# Patient Record
Sex: Male | Born: 1955 | Race: Black or African American | Hispanic: No | Smoking: Current every day smoker
Health system: Southern US, Community
[De-identification: ages and names within clinical notes are randomized; demographics above are authoritative.]

## PROBLEM LIST (undated history)

## (undated) DIAGNOSIS — R0600 Dyspnea, unspecified: Secondary | ICD-10-CM

## (undated) DIAGNOSIS — S72009A Fracture of unspecified part of neck of unspecified femur, initial encounter for closed fracture: Secondary | ICD-10-CM

## (undated) DIAGNOSIS — I639 Cerebral infarction, unspecified: Secondary | ICD-10-CM

## (undated) DIAGNOSIS — I10 Essential (primary) hypertension: Secondary | ICD-10-CM

---

## 1998-03-28 ENCOUNTER — Emergency Department (HOSPITAL_COMMUNITY): Admission: EM | Admit: 1998-03-28 | Discharge: 1998-03-28 | Payer: Self-pay | Admitting: Emergency Medicine

## 1998-08-22 ENCOUNTER — Emergency Department (HOSPITAL_COMMUNITY): Admission: EM | Admit: 1998-08-22 | Discharge: 1998-08-22 | Payer: Self-pay | Admitting: Emergency Medicine

## 1999-06-28 ENCOUNTER — Encounter: Payer: Self-pay | Admitting: Emergency Medicine

## 1999-06-28 ENCOUNTER — Emergency Department (HOSPITAL_COMMUNITY): Admission: EM | Admit: 1999-06-28 | Discharge: 1999-06-28 | Payer: Self-pay | Admitting: Emergency Medicine

## 1999-07-06 ENCOUNTER — Encounter: Payer: Self-pay | Admitting: Emergency Medicine

## 1999-07-06 ENCOUNTER — Emergency Department (HOSPITAL_COMMUNITY): Admission: EM | Admit: 1999-07-06 | Discharge: 1999-07-06 | Payer: Self-pay | Admitting: Emergency Medicine

## 2010-06-24 ENCOUNTER — Inpatient Hospital Stay (HOSPITAL_COMMUNITY)
Admission: EM | Admit: 2010-06-24 | Discharge: 2010-06-27 | DRG: 183 | Disposition: A | Discharge status: Home / Self Care | Payer: Medicaid Other | Source: Other Acute Inpatient Hospital | Attending: General Surgery | Admitting: General Surgery

## 2010-06-24 ENCOUNTER — Emergency Department (HOSPITAL_COMMUNITY): Payer: Medicaid Other

## 2010-06-24 ENCOUNTER — Emergency Department (HOSPITAL_COMMUNITY)
Admission: EM | Admit: 2010-06-24 | Discharge: 2010-06-24 | Disposition: A | Discharge status: Other Acute Inpatient Hospital | Payer: Medicaid Other | Attending: Emergency Medicine | Admitting: Emergency Medicine

## 2010-06-24 DIAGNOSIS — K7030 Alcoholic cirrhosis of liver without ascites: Secondary | ICD-10-CM | POA: Diagnosis present

## 2010-06-24 DIAGNOSIS — S2249XA Multiple fractures of ribs, unspecified side, initial encounter for closed fracture: Principal | ICD-10-CM | POA: Diagnosis present

## 2010-06-24 DIAGNOSIS — Y929 Unspecified place or not applicable: Secondary | ICD-10-CM

## 2010-06-24 DIAGNOSIS — IMO0002 Reserved for concepts with insufficient information to code with codable children: Secondary | ICD-10-CM | POA: Insufficient documentation

## 2010-06-24 DIAGNOSIS — F17200 Nicotine dependence, unspecified, uncomplicated: Secondary | ICD-10-CM | POA: Diagnosis present

## 2010-06-24 DIAGNOSIS — D62 Acute posthemorrhagic anemia: Secondary | ICD-10-CM | POA: Diagnosis present

## 2010-06-24 DIAGNOSIS — S36039A Unspecified laceration of spleen, initial encounter: Secondary | ICD-10-CM | POA: Diagnosis present

## 2010-06-24 DIAGNOSIS — I959 Hypotension, unspecified: Secondary | ICD-10-CM | POA: Insufficient documentation

## 2010-06-24 DIAGNOSIS — S3600XA Unspecified injury of spleen, initial encounter: Secondary | ICD-10-CM | POA: Insufficient documentation

## 2010-06-24 DIAGNOSIS — R4182 Altered mental status, unspecified: Secondary | ICD-10-CM | POA: Insufficient documentation

## 2010-06-24 DIAGNOSIS — F1020 Alcohol dependence, uncomplicated: Secondary | ICD-10-CM | POA: Diagnosis present

## 2010-06-24 DIAGNOSIS — Y998 Other external cause status: Secondary | ICD-10-CM | POA: Insufficient documentation

## 2010-06-24 DIAGNOSIS — R188 Other ascites: Secondary | ICD-10-CM | POA: Insufficient documentation

## 2010-06-24 DIAGNOSIS — Y92009 Unspecified place in unspecified non-institutional (private) residence as the place of occurrence of the external cause: Secondary | ICD-10-CM | POA: Insufficient documentation

## 2010-06-24 DIAGNOSIS — J9819 Other pulmonary collapse: Secondary | ICD-10-CM | POA: Diagnosis present

## 2010-06-24 LAB — URINALYSIS, ROUTINE W REFLEX MICROSCOPIC
Glucose, UA: NEGATIVE mg/dL
Leukocytes, UA: NEGATIVE
Specific Gravity, Urine: 1.025 (ref 1.005–1.030)
pH: 5.5 (ref 5.0–8.0)

## 2010-06-24 LAB — CBC
HCT: 34.6 % — ABNORMAL LOW (ref 39.0–52.0)
HCT: 28.5 % — ABNORMAL LOW (ref 39.0–52.0)
Hemoglobin: 12.3 g/dL — ABNORMAL LOW (ref 13.0–17.0)
Hemoglobin: 10.1 g/dL — ABNORMAL LOW (ref 13.0–17.0)
MCHC: 35 g/dL (ref 30.0–36.0)
MCHC: 35.4 g/dL (ref 30.0–36.0)
Platelets: 203 10*3/uL (ref 150–400)
Platelets: 214 10*3/uL (ref 150–400)
RBC: 3.61 MIL/uL — ABNORMAL LOW (ref 4.22–5.81)
RDW: 13.5 % (ref 11.5–15.5)
RDW: 13.7 % (ref 11.5–15.5)
RDW: 13.7 % (ref 11.5–15.5)
WBC: 14.2 10*3/uL — ABNORMAL HIGH (ref 4.0–10.5)
WBC: 12.6 10*3/uL — ABNORMAL HIGH (ref 4.0–10.5)
WBC: 11.2 10*3/uL — ABNORMAL HIGH (ref 4.0–10.5)
WBC: 9.8 10*3/uL (ref 4.0–10.5)

## 2010-06-24 LAB — URINE MICROSCOPIC-ADD ON

## 2010-06-24 LAB — DIFFERENTIAL
Basophils Absolute: 0 10*3/uL (ref 0.0–0.1)
Basophils Absolute: 0 10*3/uL (ref 0.0–0.1)
Basophils Relative: 0 % (ref 0–1)
Eosinophils Absolute: 0 10*3/uL (ref 0.0–0.7)
Eosinophils Relative: 0 % (ref 0–5)
Lymphocytes Relative: 9 % — ABNORMAL LOW (ref 12–46)
Lymphocytes Relative: 9 % — ABNORMAL LOW (ref 12–46)
Monocytes Absolute: 0.8 10*3/uL (ref 0.1–1.0)
Neutro Abs: 12 10*3/uL — ABNORMAL HIGH (ref 1.7–7.7)
Neutrophils Relative %: 85 % — ABNORMAL HIGH (ref 43–77)

## 2010-06-24 LAB — PROTIME-INR: INR: 1.19 (ref 0.00–1.49)

## 2010-06-24 LAB — COMPREHENSIVE METABOLIC PANEL
AST: 42 U/L — ABNORMAL HIGH (ref 0–37)
Albumin: 3.9 g/dL (ref 3.5–5.2)
Chloride: 100 mEq/L (ref 96–112)
Creatinine, Ser: 1.51 mg/dL — ABNORMAL HIGH (ref 0.50–1.35)
Potassium: 4.6 mEq/L (ref 3.5–5.1)
Total Bilirubin: 0.3 mg/dL (ref 0.3–1.2)
Total Protein: 7.3 g/dL (ref 6.0–8.3)

## 2010-06-24 LAB — MRSA PCR SCREENING: MRSA by PCR: NEGATIVE

## 2010-06-24 LAB — APTT: aPTT: 28 seconds (ref 24–37)

## 2010-06-24 MED ORDER — IOHEXOL 300 MG/ML  SOLN
100.0000 mL | Freq: Once | INTRAMUSCULAR | Status: AC | PRN
  Administered 2010-06-24: 100 mL via INTRAVENOUS

## 2010-06-25 ENCOUNTER — Inpatient Hospital Stay (HOSPITAL_COMMUNITY): Payer: Medicaid Other

## 2010-06-25 LAB — URINE CULTURE
Colony Count: NO GROWTH
Culture  Setup Time: 201206192008
Culture: NO GROWTH

## 2010-06-25 LAB — CBC
Hemoglobin: 9.3 g/dL — ABNORMAL LOW (ref 13.0–17.0)
Hemoglobin: 9.7 g/dL — ABNORMAL LOW (ref 13.0–17.0)
MCH: 31.8 pg (ref 26.0–34.0)
MCV: 87.7 fL (ref 78.0–100.0)
Platelets: 164 10*3/uL (ref 150–400)
Platelets: 182 10*3/uL (ref 150–400)
RBC: 2.92 MIL/uL — ABNORMAL LOW (ref 4.22–5.81)
RBC: 3.14 MIL/uL — ABNORMAL LOW (ref 4.22–5.81)
WBC: 8.9 10*3/uL (ref 4.0–10.5)
WBC: 8.6 10*3/uL (ref 4.0–10.5)

## 2010-06-25 LAB — BASIC METABOLIC PANEL
Calcium: 8.1 mg/dL — ABNORMAL LOW (ref 8.4–10.5)
GFR calc non Af Amer: >60 mL/min (ref >60)
Glucose, Bld: 91 mg/dL (ref 70–99)
Potassium: 3.9 mEq/L (ref 3.5–5.1)
Sodium: 134 mEq/L — ABNORMAL LOW (ref 135–145)

## 2010-06-25 LAB — APTT: aPTT: 34 seconds (ref 24–37)

## 2010-06-25 LAB — PROTIME-INR: Prothrombin Time: 14.9 seconds (ref 11.6–15.2)

## 2010-06-26 LAB — COMPREHENSIVE METABOLIC PANEL
ALT: 20 U/L (ref 0–53)
AST: 20 U/L (ref 0–37)
Albumin: 3.1 g/dL — ABNORMAL LOW (ref 3.5–5.2)
CO2: 27 mEq/L (ref 19–32)
Calcium: 8.6 mg/dL (ref 8.4–10.5)
Creatinine, Ser: 0.81 mg/dL (ref 0.50–1.35)
GFR calc non Af Amer: >60 mL/min (ref >60)
Sodium: 137 mEq/L (ref 135–145)
Total Protein: 6 g/dL (ref 6.0–8.3)

## 2010-06-26 LAB — CBC
MCH: 31.6 pg (ref 26.0–34.0)
MCHC: 36.4 g/dL — ABNORMAL HIGH (ref 30.0–36.0)
MCV: 86.8 fL (ref 78.0–100.0)
Platelets: 179 10*3/uL (ref 150–400)
RDW: 13.1 % (ref 11.5–15.5)

## 2010-06-27 LAB — CBC
HCT: 27.1 % — ABNORMAL LOW (ref 39.0–52.0)
Hemoglobin: 9.6 g/dL — ABNORMAL LOW (ref 13.0–17.0)
MCH: 30.8 pg (ref 26.0–34.0)
RBC: 3.12 MIL/uL — ABNORMAL LOW (ref 4.22–5.81)
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
## 2010-07-10 ENCOUNTER — Ambulatory Visit (INDEPENDENT_AMBULATORY_CARE_PROVIDER_SITE_OTHER): Payer: Self-pay | Admitting: Physician Assistant

## 2010-07-10 DIAGNOSIS — D62 Acute posthemorrhagic anemia: Secondary | ICD-10-CM | POA: Insufficient documentation

## 2010-07-10 DIAGNOSIS — S2249XA Multiple fractures of ribs, unspecified side, initial encounter for closed fracture: Secondary | ICD-10-CM | POA: Insufficient documentation

## 2010-07-10 DIAGNOSIS — S36039A Unspecified laceration of spleen, initial encounter: Secondary | ICD-10-CM | POA: Insufficient documentation
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
## 2010-07-10 NOTE — Patient Instructions (Signed)
Gradually increase activities as tolerated.  Follow-up as needed.

## 2010-08-03 ENCOUNTER — Other Ambulatory Visit: Payer: Self-pay

## 2010-08-03 ENCOUNTER — Inpatient Hospital Stay (HOSPITAL_COMMUNITY)
Admission: EM | Admit: 2010-08-03 | Discharge: 2010-08-06 | DRG: 815 | Disposition: A | Discharge status: Home / Self Care | Payer: Medicaid Other | Attending: General Surgery | Admitting: General Surgery

## 2010-08-03 ENCOUNTER — Emergency Department (HOSPITAL_COMMUNITY): Payer: Medicaid Other

## 2010-08-03 ENCOUNTER — Encounter: Payer: Self-pay | Admitting: Emergency Medicine

## 2010-08-03 ENCOUNTER — Emergency Department (HOSPITAL_COMMUNITY)
Admission: EM | Admit: 2010-08-03 | Discharge: 2010-08-03 | Disposition: A | Discharge status: Other Acute Inpatient Hospital | Payer: Medicaid Other | Source: Home / Self Care | Attending: Emergency Medicine | Admitting: Emergency Medicine

## 2010-08-03 DIAGNOSIS — D62 Acute posthemorrhagic anemia: Secondary | ICD-10-CM | POA: Diagnosis present

## 2010-08-03 DIAGNOSIS — Z87891 Personal history of nicotine dependence: Secondary | ICD-10-CM | POA: Insufficient documentation

## 2010-08-03 DIAGNOSIS — S36039A Unspecified laceration of spleen, initial encounter: Secondary | ICD-10-CM

## 2010-08-03 DIAGNOSIS — R079 Chest pain, unspecified: Secondary | ICD-10-CM

## 2010-08-03 DIAGNOSIS — S2249XA Multiple fractures of ribs, unspecified side, initial encounter for closed fracture: Secondary | ICD-10-CM | POA: Insufficient documentation

## 2010-08-03 DIAGNOSIS — K5900 Constipation, unspecified: Secondary | ICD-10-CM | POA: Diagnosis present

## 2010-08-03 DIAGNOSIS — S3600XA Unspecified injury of spleen, initial encounter: Secondary | ICD-10-CM

## 2010-08-03 DIAGNOSIS — IMO0001 Reserved for inherently not codable concepts without codable children: Secondary | ICD-10-CM

## 2010-08-03 DIAGNOSIS — S3609XA Other injury of spleen, initial encounter: Secondary | ICD-10-CM | POA: Insufficient documentation

## 2010-08-03 DIAGNOSIS — F17200 Nicotine dependence, unspecified, uncomplicated: Secondary | ICD-10-CM | POA: Diagnosis present

## 2010-08-03 LAB — TYPE AND SCREEN
ABO/RH(D): O POS
Antibody Screen: NEGATIVE

## 2010-08-03 LAB — CARDIAC PANEL(CRET KIN+CKTOT+MB+TROPI)
CK, MB: 2.3 ng/mL (ref 0.3–4.0)
Relative Index: 1.3 (ref 0.0–2.5)
Troponin I: <0.3 ng/mL (ref <0.30)

## 2010-08-03 LAB — GLUCOSE, CAPILLARY

## 2010-08-03 LAB — COMPREHENSIVE METABOLIC PANEL
ALT: 8 U/L (ref 0–53)
AST: 17 U/L (ref 0–37)
Albumin: 3.8 g/dL (ref 3.5–5.2)
Alkaline Phosphatase: 63 U/L (ref 39–117)
BUN: 21 mg/dL (ref 6–23)
Chloride: 103 mEq/L (ref 96–112)
Potassium: 3.5 mEq/L (ref 3.5–5.1)
Sodium: 139 mEq/L (ref 135–145)
Total Bilirubin: 0.7 mg/dL (ref 0.3–1.2)
Total Protein: 7.9 g/dL (ref 6.0–8.3)

## 2010-08-03 LAB — CBC
HCT: 26 % — ABNORMAL LOW (ref 39.0–52.0)
Hemoglobin: 9.2 g/dL — ABNORMAL LOW (ref 13.0–17.0)
Hemoglobin: 8 g/dL — ABNORMAL LOW (ref 13.0–17.0)
MCV: 87.2 fL (ref 78.0–100.0)
RBC: 2.67 MIL/uL — ABNORMAL LOW (ref 4.22–5.81)
RDW: 13.1 % (ref 11.5–15.5)
WBC: 15.7 10*3/uL — ABNORMAL HIGH (ref 4.0–10.5)
WBC: 12.5 10*3/uL — ABNORMAL HIGH (ref 4.0–10.5)

## 2010-08-03 LAB — APTT: aPTT: 35 seconds (ref 24–37)

## 2010-08-03 LAB — ABO/RH: ABO/RH(D): O POS

## 2010-08-03 LAB — D-DIMER, QUANTITATIVE: D-Dimer, Quant: 6.7 ug/mL-FEU — ABNORMAL HIGH (ref 0.00–0.48)

## 2010-08-03 MED ORDER — ALBUTEROL SULFATE (5 MG/ML) 0.5% IN NEBU
INHALATION_SOLUTION | RESPIRATORY_TRACT | Status: AC
  Filled 2010-08-03: qty 1

## 2010-08-03 MED ORDER — SODIUM CHLORIDE 0.9 % IV SOLN
Freq: Once | INTRAVENOUS | Status: AC
  Administered 2010-08-03: 14:00:00 via INTRAVENOUS

## 2010-08-03 MED ORDER — ALBUTEROL SULFATE (5 MG/ML) 0.5% IN NEBU: 2.5000 mg | INHALATION_SOLUTION | RESPIRATORY_TRACT | Status: DC

## 2010-08-03 MED ORDER — SODIUM CHLORIDE 0.9 % IJ SOLN: 3.0000 mL | INTRAMUSCULAR | Status: DC | PRN

## 2010-08-03 MED ORDER — MORPHINE SULFATE 4 MG/ML IJ SOLN
4.0000 mg | Freq: Once | INTRAMUSCULAR | Status: AC
  Administered 2010-08-03: 4 mg via INTRAVENOUS
  Filled 2010-08-03: qty 1

## 2010-08-03 MED ORDER — NITROGLYCERIN 0.4 MG/SPRAY TL SOLN
1.0000 | Freq: Once | Status: AC
  Administered 2010-08-03: 1 via SUBLINGUAL
  Filled 2010-08-03: qty 4.9

## 2010-08-03 MED ORDER — ALBUTEROL SULFATE (5 MG/ML) 0.5% IN NEBU: 5.0000 mg | INHALATION_SOLUTION | RESPIRATORY_TRACT | Status: DC

## 2010-08-03 MED ORDER — IOHEXOL 300 MG/ML  SOLN
50.0000 mL | Freq: Once | INTRAMUSCULAR | Status: AC | PRN
  Administered 2010-08-03: 50 mL via INTRAVENOUS

## 2010-08-03 MED ORDER — IOHEXOL 350 MG/ML SOLN
100.0000 mL | Freq: Once | INTRAVENOUS | Status: AC | PRN
  Administered 2010-08-03: 100 mL via INTRAVENOUS

## 2010-08-03 NOTE — ED Notes (Signed)
Pt dosing on and off, family at bedside. Rates pain 5/10. Vitals as documented. No acute distress noted. Airway patent.
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
## 2010-08-04 LAB — CBC
HCT: 20.6 % — ABNORMAL LOW (ref 39.0–52.0)
HCT: 22.6 % — ABNORMAL LOW (ref 39.0–52.0)
HCT: 23.6 % — ABNORMAL LOW (ref 39.0–52.0)
Hemoglobin: 7.6 g/dL — ABNORMAL LOW (ref 13.0–17.0)
MCH: 30.2 pg (ref 26.0–34.0)
MCH: 29.9 pg (ref 26.0–34.0)
MCH: 30 pg (ref 26.0–34.0)
MCHC: 34.5 g/dL (ref 30.0–36.0)
MCV: 85.8 fL (ref 78.0–100.0)
MCV: 86.1 fL (ref 78.0–100.0)
MCV: 86.9 fL (ref 78.0–100.0)
MCV: 87.1 fL (ref 78.0–100.0)
Platelets: 241 10*3/uL (ref 150–400)
Platelets: 300 10*3/uL (ref 150–400)
RBC: 2.4 MIL/uL — ABNORMAL LOW (ref 4.22–5.81)
RBC: 2.52 MIL/uL — ABNORMAL LOW (ref 4.22–5.81)
RBC: 2.71 MIL/uL — ABNORMAL LOW (ref 4.22–5.81)
RDW: 13.4 % (ref 11.5–15.5)
WBC: 9.3 10*3/uL (ref 4.0–10.5)

## 2010-08-04 LAB — BASIC METABOLIC PANEL
CO2: 26 mEq/L (ref 19–32)
Calcium: 8.6 mg/dL (ref 8.4–10.5)
Glucose, Bld: 116 mg/dL — ABNORMAL HIGH (ref 70–99)
Sodium: 139 mEq/L (ref 135–145)

## 2010-08-05 LAB — CBC
MCH: 30.3 pg (ref 26.0–34.0)
MCHC: 34.7 g/dL (ref 30.0–36.0)
MCHC: 34.7 g/dL (ref 30.0–36.0)
Platelets: 279 10*3/uL (ref 150–400)
Platelets: 317 10*3/uL (ref 150–400)
RDW: 13.3 % (ref 11.5–15.5)
RDW: 13.4 % (ref 11.5–15.5)
WBC: 11 10*3/uL — ABNORMAL HIGH (ref 4.0–10.5)

## 2010-08-06 LAB — CBC
Hemoglobin: 7.8 g/dL — ABNORMAL LOW (ref 13.0–17.0)
Platelets: 356 10*3/uL (ref 150–400)
RBC: 2.6 MIL/uL — ABNORMAL LOW (ref 4.22–5.81)
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
## 2010-08-14 ENCOUNTER — Encounter (INDEPENDENT_AMBULATORY_CARE_PROVIDER_SITE_OTHER): Payer: Self-pay
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
## 2017-04-20 ENCOUNTER — Other Ambulatory Visit (HOSPITAL_COMMUNITY): Payer: Self-pay | Admitting: Internal Medicine

## 2017-04-20 DIAGNOSIS — R109 Unspecified abdominal pain: Secondary | ICD-10-CM

## 2017-04-29 ENCOUNTER — Other Ambulatory Visit (HOSPITAL_COMMUNITY): Payer: Self-pay | Admitting: Internal Medicine

## 2017-04-29 ENCOUNTER — Ambulatory Visit (HOSPITAL_COMMUNITY): Payer: Medicaid Other

## 2017-04-29 DIAGNOSIS — R109 Unspecified abdominal pain: Secondary | ICD-10-CM

## 2017-05-03 ENCOUNTER — Ambulatory Visit (HOSPITAL_COMMUNITY): Admission: RE | Admit: 2017-05-03 | Payer: Medicaid Other | Source: Ambulatory Visit

## 2017-05-03 ENCOUNTER — Ambulatory Visit (HOSPITAL_COMMUNITY)
Admission: RE | Admit: 2017-05-03 | Discharge: 2017-05-03 | Disposition: A | Discharge status: Home / Self Care | Payer: Medicaid Other | Source: Ambulatory Visit | Attending: Internal Medicine | Admitting: Internal Medicine

## 2017-05-03 ENCOUNTER — Other Ambulatory Visit (HOSPITAL_COMMUNITY): Payer: Medicaid Other

## 2017-05-03 DIAGNOSIS — K824 Cholesterolosis of gallbladder: Secondary | ICD-10-CM | POA: Diagnosis not present

## 2017-05-03 DIAGNOSIS — R109 Unspecified abdominal pain: Secondary | ICD-10-CM | POA: Diagnosis present

## 2017-05-14 ENCOUNTER — Ambulatory Visit (HOSPITAL_COMMUNITY): Payer: Medicaid Other

## 2017-09-23 ENCOUNTER — Ambulatory Visit (INDEPENDENT_AMBULATORY_CARE_PROVIDER_SITE_OTHER): Payer: Self-pay

## 2017-09-23 DIAGNOSIS — Z1211 Encounter for screening for malignant neoplasm of colon: Secondary | ICD-10-CM

## 2017-09-23 MED ORDER — NA SULFATE-K SULFATE-MG SULF 17.5-3.13-1.6 GM/177ML PO SOLN
1.0000 | ORAL | 0 refills | Status: DC
Start: 2017-09-23 — End: 2022-06-10

## 2017-09-23 NOTE — Progress Notes (Signed)
Gastroenterology Pre-Procedure Review  Request Date:09/23/17 Requesting Physician: Dr.Fanta, no previous tcs  PATIENT REVIEW QUESTIONS: The patient responded to the following health history questions as indicated:    1. Diabetes Melitis: no 2. Joint replacements in the past 12 months: no 3. Major health problems in the past 3 months: no 4. Has an artificial valve or MVP: no 5. Has a defibrillator: no 6. Has been advised in past to take antibiotics in advance of a procedure like teeth cleaning: no 7. Family history of colon cancer: no  8. Alcohol Use: yes (occasionally) 9. History of sleep apnea: no  10. History of coronary artery or other vascular stents placed within the last 12 months: no 11. History of any prior anesthesia complications: no    MEDICATIONS & ALLERGIES:    Patient reports the following regarding taking any blood thinners:   Plavix? no Aspirin? no Coumadin? no Brilinta? no Xarelto? no Eliquis? no Pradaxa? no Savaysa? no Effient? no  Patient confirms/reports the following medications:  Current Outpatient Medications  Medication Sig Dispense Refill  . amLODipine (NORVASC) 5 MG tablet Take 5 mg by mouth daily.     No current facility-administered medications for this visit.     Patient confirms/reports the following allergies:  Allergies  Allergen Reactions  . Fish Allergy Anaphylaxis    No orders of the defined types were placed in this encounter.   AUTHORIZATION INFORMATION Primary Insurance: [place],  [place] #: [id number] p Pre-Cert / Josem Kaufmann required: no   SCHEDULE INFORMATION: Procedure has been scheduled as follows:  Date: 11/05/17, Time: 12:00  Location: [facility] Dr.Rourk  This Gastroenterology Pre-Precedure Review Form is being routed to the following provider(s): Roseanne Kaufman NP

## 2017-09-23 NOTE — Patient Instructions (Signed)
Austin [name]  [birth year] MRN: [id number]     Procedure Date: 11/05/17 Time to register: 11:00am Place to register: [facility] Procedure Time: 12:00pm Scheduled provider: R. Garfield Cornea, MD    PREPARATION FOR COLONOSCOPY WITH SUPREP BOWEL PREP KIT  Note: Suprep Bowel Prep Kit is a split-dose (2day) regimen. Consumption of BOTH 6-ounce bottles is required for a complete prep.  Please notify us immediately if you are diabetic, take iron supplements, or if you are on Coumadin or any other blood thinners.                                                                                                                                                 2 DAYS BEFORE PROCEDURE:  DATE: 11/03/17  DAY: Wednesday Begin clear liquid diet AFTER your lunch meal. NO SOLID FOODS after this point.  1 DAY BEFORE PROCEDURE:  DATE:11/04/17    DAY: Thursday Continue clear liquids the entire day - NO SOLID FOOD.    At 6:00pm: Complete steps 1 through 4 below, using ONE (1) 6-ounce bottle, before going to bed. Step 1:  Pour ONE (1) 6-ounce bottle of SUPREP liquid into the mixing container.  Step 2:  Add cool drinking water to the 16 ounce line on the container and mix.  Note: Dilute the solution concentrate as directed prior to use. Step 3:  DRINK ALL the liquid in the container. Step 4:  You MUST drink an additional two (2) or more 16 ounce containers of water over the next one (1) hour.   Continue clear liquids.  DAY OF PROCEDURE:   DATE:11/05/17    DAY: Friday If you take medications for your heart, blood pressure, or breathing, you may take these medications.   5 hours before your procedure at :7:00am Step 1:  Pour ONE (1) 6-ounce bottle of SUPREP liquid into the mixing container.  Step 2:  Add cool drinking water to the 16 ounce line on the container and mix.  Note: Dilute the solution concentrate as directed prior to use. Step 3:  DRINK ALL the liquid in the container. Step 4:   You MUST drink an additional two (2) or more 16 ounce containers of water over the next one (1) hour. You MUST complete the final glass of water at least 3 hours before your colonoscopy.   Nothing by mouth past:9:00am  You may take your morning medications with sip of water unless we have instructed otherwise.    Please see below for Dietary Information.  CLEAR LIQUIDS INCLUDE:  Water Jello (NOT red in color)   Ice Popsicles (NOT red in color)   Tea (sugar ok, no milk/cream) Powdered fruit flavored drinks  Coffee (sugar ok, no milk/cream) Gatorade/ Lemonade/ Kool-Aid  (NOT red in color)   Juice: apple, white grape, white cranberry Soft drinks  Clear bullion,  consomme, broth (fat free beef/chicken/vegetable)  Carbonated beverages (any kind)  Strained chicken noodle soup Hard Candy   Remember: Clear liquids are liquids that will allow you to see your fingers on the other side of a clear glass. Be sure liquids are NOT red in color, and not cloudy, but CLEAR.  DO NOT EAT OR DRINK ANY OF THE FOLLOWING:  Dairy products of any kind   Cranberry juice Tomato juice / V8 juice   Grapefruit juice Orange juice     Red grape juice  Do not eat any solid foods, including such foods as: cereal, oatmeal, yogurt, fruits, vegetables, creamed soups, eggs, bread, crackers, pureed foods in a blender, etc.   HELPFUL HINTS FOR DRINKING PREP SOLUTION:   Make sure prep is extremely cold. Mix and refrigerate the the morning of the prep. You may also put in the freezer.   You may try mixing some Crystal Light or Country Time Lemonade if you prefer. Mix in small amounts; add more if necessary.  Try drinking through a straw  Rinse mouth with water or a mouthwash between glasses, to remove after-taste.  Try sipping on a cold beverage /ice/ popsicles between glasses of prep.  Place a piece of sugar-free hard candy in mouth between glasses.  If you become nauseated, try consuming smaller amounts, or stretch  out the time between glasses. Stop for 30-60 minutes, then slowly start back drinking.     OTHER INSTRUCTIONS  You will need a responsible adult at least 64 years of age to accompany you and drive you home. This person must remain in the waiting room during your procedure. The hospital will cancel your procedure if you do not have a responsible adult with you.   1. Wear loose fitting clothing that is easily removed. 2. Leave jewelry and other valuables at home.  3. Remove all body piercing jewelry and leave at home. 4. Total time from sign-in until discharge is approximately 2-3 hours. 5. You should go home directly after your procedure and rest. You can resume normal activities the day after your procedure. 6. The day of your procedure you should not:  Drive  Make legal decisions  Operate machinery  Drink alcohol  Return to work   You may call the office (Dept: [phone]) before 5:00pm, or page the doctor on call [phone]) after 5:00pm, for further instructions, if necessary.   Insurance Information YOU WILL NEED TO CHECK WITH YOUR INSURANCE COMPANY FOR THE BENEFITS OF COVERAGE YOU HAVE FOR THIS PROCEDURE.  UNFORTUNATELY, NOT ALL INSURANCE COMPANIES HAVE BENEFITS TO COVER ALL OR PART OF THESE TYPES OF PROCEDURES.  IT IS YOUR RESPONSIBILITY TO CHECK YOUR BENEFITS, HOWEVER, WE WILL BE GLAD TO ASSIST YOU WITH ANY CODES YOUR INSURANCE COMPANY MAY NEED.    PLEASE NOTE THAT MOST INSURANCE COMPANIES WILL NOT COVER A SCREENING COLONOSCOPY FOR PEOPLE UNDER THE AGE OF 50  IF YOU HAVE BCBS INSURANCE, YOU MAY HAVE BENEFITS FOR A SCREENING COLONOSCOPY BUT IF POLYPS ARE FOUND THE DIAGNOSIS WILL CHANGE AND THEN YOU MAY HAVE A DEDUCTIBLE THAT WILL NEED TO BE MET. SO PLEASE MAKE SURE YOU CHECK YOUR BENEFITS FOR A SCREENING COLONOSCOPY AS WELL AS A DIAGNOSTIC COLONOSCOPY.

## 2017-09-27 NOTE — Progress Notes (Signed)
Appropriate.

## 2017-11-05 ENCOUNTER — Encounter (HOSPITAL_COMMUNITY): Payer: Self-pay | Admitting: *Deleted

## 2017-11-05 ENCOUNTER — Encounter (HOSPITAL_COMMUNITY)
Admission: RE | Disposition: A | Discharge status: Home / Self Care | Payer: Self-pay | Source: Ambulatory Visit | Attending: Internal Medicine

## 2017-11-05 ENCOUNTER — Ambulatory Visit (HOSPITAL_COMMUNITY)
Admission: RE | Admit: 2017-11-05 | Discharge: 2017-11-05 | Disposition: A | Discharge status: Home / Self Care | Payer: Medicaid Other | Source: Ambulatory Visit | Attending: Internal Medicine | Admitting: Internal Medicine

## 2017-11-05 ENCOUNTER — Other Ambulatory Visit: Payer: Self-pay

## 2017-11-05 DIAGNOSIS — Z1211 Encounter for screening for malignant neoplasm of colon: Secondary | ICD-10-CM | POA: Insufficient documentation

## 2017-11-05 DIAGNOSIS — Z87891 Personal history of nicotine dependence: Secondary | ICD-10-CM | POA: Diagnosis not present

## 2017-11-05 DIAGNOSIS — D124 Benign neoplasm of descending colon: Secondary | ICD-10-CM | POA: Insufficient documentation

## 2017-11-05 DIAGNOSIS — D123 Benign neoplasm of transverse colon: Secondary | ICD-10-CM | POA: Diagnosis not present

## 2017-11-05 DIAGNOSIS — D125 Benign neoplasm of sigmoid colon: Secondary | ICD-10-CM | POA: Diagnosis not present

## 2017-11-05 DIAGNOSIS — K5730 Diverticulosis of large intestine without perforation or abscess without bleeding: Secondary | ICD-10-CM | POA: Insufficient documentation

## 2017-11-05 HISTORY — PX: POLYPECTOMY: SHX5525

## 2017-11-05 HISTORY — DX: Dyspnea, unspecified: R06.00

## 2017-11-05 HISTORY — PX: COLONOSCOPY: SHX5424

## 2017-11-05 SURGERY — COLONOSCOPY
Anesthesia: Moderate Sedation

## 2017-11-05 MED ORDER — MEPERIDINE HCL 100 MG/ML IJ SOLN
INTRAMUSCULAR | Status: DC | PRN
  Administered 2017-11-05 (×2): 25 mg via INTRAVENOUS

## 2017-11-05 MED ORDER — MIDAZOLAM HCL 5 MG/5ML IJ SOLN
INTRAMUSCULAR | Status: AC
  Filled 2017-11-05: qty 10

## 2017-11-05 MED ORDER — ONDANSETRON HCL 4 MG/2ML IJ SOLN
INTRAMUSCULAR | Status: DC | PRN
  Administered 2017-11-05: 4 mg via INTRAVENOUS

## 2017-11-05 MED ORDER — SODIUM CHLORIDE 0.9 % IV SOLN
INTRAVENOUS | Status: DC
  Administered 2017-11-05: 1000 mL via INTRAVENOUS

## 2017-11-05 MED ORDER — MEPERIDINE HCL 50 MG/ML IJ SOLN
INTRAMUSCULAR | Status: AC
  Filled 2017-11-05: qty 1

## 2017-11-05 MED ORDER — MIDAZOLAM HCL 5 MG/5ML IJ SOLN
INTRAMUSCULAR | Status: DC | PRN
  Administered 2017-11-05 (×2): 1 mg via INTRAVENOUS
  Administered 2017-11-05: 2 mg via INTRAVENOUS
  Administered 2017-11-05: 1 mg via INTRAVENOUS

## 2017-11-05 MED ORDER — ONDANSETRON HCL 4 MG/2ML IJ SOLN
INTRAMUSCULAR | Status: AC
  Filled 2017-11-05: qty 2

## 2017-11-05 MED ORDER — STERILE WATER FOR IRRIGATION IR SOLN
Status: DC | PRN
  Administered 2017-11-05: 13:00:00

## 2017-11-05 NOTE — H&P (Signed)
$'@LOGO'G$ @   Primary Care Physician:  Rosita Fire, MD Primary Gastroenterologist:  Dr. Gala Romney  Pre-Procedure History & Physical: HPI:  Austin [name] is a 62 y.o. male is here for a screening colonoscopy.  No prior colonoscopy.  No GI symptoms.  No family history of colon cancer.  Past Medical History:  Diagnosis Date  . Dyspnea     History reviewed. No pertinent surgical history.  Prior to Admission medications   Medication Sig Start Date End Date Taking? Authorizing Provider  amLODipine (NORVASC) 5 MG tablet Take 5 mg by mouth daily.   Yes [provider]  Na Sulfate-K Sulfate-Mg Sulf (SUPREP BOWEL PREP KIT) 17.5-3.13-1.6 GM/177ML SOLN Take 1 kit by mouth as directed. 09/23/17  Yes Annitta Needs, NP    Allergies as of 09/23/2017 - Review Complete 09/23/2017  Allergen Reaction Noted  . Fish allergy Anaphylaxis 08/03/2010    Family History  Problem Relation Age of Onset  . Hypertension Mother   . Cancer Mother   . Hypertension Father   . Heart failure Other   . Cancer Other     Social History   Socioeconomic History  . Marital status: Legally Separated    Spouse name: Not on file  . Number of children: Not on file  . Years of education: Not on file  . Highest education level: Not on file  Occupational History  . Not on file  Social Needs  . Financial resource strain: Not on file  . Food insecurity:    Worry: Not on file    Inability: Not on file  . Transportation needs:    Medical: Not on file    Non-medical: Not on file  Tobacco Use  . Smoking status: Former Smoker    Packs/day: 0.50    Years: 43.00    Pack years: 21.50  . Smokeless tobacco: Never Used  . Tobacco comment: last used 2 days ago  Substance and Sexual Activity  . Alcohol use: Yes    Comment: beer 2 cans per day  . Drug use: Yes    Types: Marijuana  . Sexual activity: Yes    Birth control/protection: None  Lifestyle  . Physical activity:    Days per week: Not on file   Minutes per session: Not on file  . Stress: Not on file  Relationships  . Social connections:    Talks on phone: Not on file    Gets together: Not on file    Attends religious service: Not on file    Active member of club or organization: Not on file    Attends meetings of clubs or organizations: Not on file    Relationship status: Not on file  . Intimate partner violence:    Fear of current or ex partner: Not on file    Emotionally abused: Not on file    Physically abused: Not on file    Forced sexual activity: Not on file  Other Topics Concern  . Not on file  Social History Narrative  . Not on file    Review of Systems: See HPI, otherwise negative ROS  Physical Exam: BP (!) 145/94   Pulse 99   Temp 98.7 F (37.1 C) (Oral)   Resp 18   Ht 5' 8.5" (1.74 m)   Wt 70.3 kg   SpO2 99%   BMI 23.22 kg/m  General:   Alert,  Well-developed, well-nourished, pleasant and cooperative in NAD te distress. Heart:  Regular rate and rhythm; no  murmurs, clicks, rubs,  or gallops. Abdomen:  Soft, nontender and nondistended. No masses, hepatosplenomegaly or hernias noted. Normal bowel sounds, without guarding, and without rebound.   Impression/Plan: Austin [name] is now here to undergo a screening colonoscopy.  First ever average risk screening examination  Risks, benefits, limitations, imponderables and alternatives regarding colonoscopy have been reviewed with the patient. Questions have been answered. All parties agreeable.     Notice:  This dictation was prepared with Dragon dictation along with smaller phrase technology. Any transcriptional errors that result from this process are unintentional and may not be corrected upon review.

## 2017-11-05 NOTE — Discharge Instructions (Signed)
Diverticulosis Diverticulosis is a condition that develops when small pouches (diverticula) form in the wall of the large intestine (colon). The colon is where water is absorbed and stool is formed. The pouches form when the inside layer of the colon pushes through weak spots in the outer layers of the colon. You may have a few pouches or many of them. What are the causes? The cause of this condition is not known. What increases the risk? The following factors may make you more likely to develop this condition:  Being older than age 27. Your risk for this condition increases with age. Diverticulosis is rare among people younger than age 36. By age 28, many people have it.  Eating a low-fiber diet.  Having frequent constipation.  Being overweight.  Not getting enough exercise.  Smoking.  Taking over-the-counter pain medicines, like aspirin and ibuprofen.  Having a family history of diverticulosis.  What are the signs or symptoms? In most people, there are no symptoms of this condition. If you do have symptoms, they may include:  Bloating.  Cramps in the abdomen.  Constipation or diarrhea.  Pain in the lower left side of the abdomen.  How is this diagnosed? This condition is most often diagnosed during an exam for other colon problems. Because diverticulosis usually has no symptoms, it often cannot be diagnosed independently. This condition may be diagnosed by:  Using a flexible scope to examine the colon (colonoscopy).  Taking an X-ray of the colon after dye has been put into the colon (barium enema).  Doing a CT scan.  How is this treated? You may not need treatment for this condition if you have never developed an infection related to diverticulosis. If you have had an infection before, treatment may include:  Eating a high-fiber diet. This may include eating more fruits, vegetables, and grains.  Taking a fiber supplement.  Taking a live bacteria supplement  (probiotic).  Taking medicine to relax your colon.  Taking antibiotic medicines.  Follow these instructions at home:  Drink 6-8 glasses of water or more each day to prevent constipation.  Try not to strain when you have a bowel movement.  If you have had an infection before: ? Eat more fiber as directed by your health care provider or your diet and nutrition specialist (dietitian). ? Take a fiber supplement or probiotic, if your health care provider approves.  Take over-the-counter and prescription medicines only as told by your health care provider.  If you were prescribed an antibiotic, take it as told by your health care provider. Do not stop taking the antibiotic even if you start to feel better.  Keep all follow-up visits as told by your health care provider. This is important. Contact a health care provider if:  You have pain in your abdomen.  You have bloating.  You have cramps.  You have not had a bowel movement in 3 days. Get help right away if:  Your pain gets worse.  Your bloating becomes very bad.  You have a fever or chills, and your symptoms suddenly get worse.  You vomit.  You have bowel movements that are bloody or black.  You have bleeding from your rectum. Summary  Diverticulosis is a condition that develops when small pouches (diverticula) form in the wall of the large intestine (colon).  You may have a few pouches or many of them.  This condition is most often diagnosed during an exam for other colon problems.  If you have had an  infection related to diverticulosis, treatment may include increasing the fiber in your diet, taking supplements, or taking medicines. This information is not intended to replace advice given to you by your health care provider. Make sure you discuss any questions you have with your health care provider. Document Released: 09/19/2003 Document Revised: 11/11/2015 Document Reviewed: 11/11/2015 Elsevier Interactive  Patient Education  2017 [employer]. Colon Polyps Polyps are tissue growths inside the body. Polyps can grow in many places, including the large intestine (colon). A polyp may be a round bump or a mushroom-shaped growth. You could have one polyp or several. Most colon polyps are noncancerous (benign). However, some colon polyps can become cancerous over time. What are the causes? The exact cause of colon polyps is not known. What increases the risk? This condition is more likely to develop in people who:  Have a family history of colon cancer or colon polyps.  Are older than 53 or older than 45 if they are African American.  Have inflammatory bowel disease, such as ulcerative colitis or Crohn disease.  Are overweight.  Smoke cigarettes.  Do not get enough exercise.  Drink too much alcohol.  Eat a diet that is: ? High in fat and red meat. ? Low in fiber.  Had childhood cancer that was treated with abdominal radiation.  What are the signs or symptoms? Most polyps do not cause symptoms. If you have symptoms, they may include:  Blood coming from your rectum when having a bowel movement.  Blood in your stool.The stool may look dark red or black.  A change in bowel habits, such as constipation or diarrhea.  How is this diagnosed? This condition is diagnosed with a colonoscopy. This is a procedure that uses a lighted, flexible scope to look at the inside of your colon. How is this treated? Treatment for this condition involves removing any polyps that are found. Those polyps will then be tested for cancer. If cancer is found, your health care provider will talk to you about options for colon cancer treatment. Follow these instructions at home: Diet  Eat plenty of fiber, such as fruits, vegetables, and whole grains.  Eat foods that are high in calcium and vitamin D, such as milk, cheese, yogurt, eggs, liver, fish, and broccoli.  Limit foods high in fat, red meats, and  processed meats, such as hot dogs, sausage, bacon, and lunch meats.  Maintain a healthy weight, or lose weight if recommended by your health care provider. General instructions  Do not smoke cigarettes.  Do not drink alcohol excessively.  Keep all follow-up visits as told by your health care provider. This is important. This includes keeping regularly scheduled colonoscopies. Talk to your health care provider about when you need a colonoscopy.  Exercise every day or as told by your health care provider. Contact a health care provider if:  You have new or worsening bleeding during a bowel movement.  You have new or increased blood in your stool.  You have a change in bowel habits.  You unexpectedly lose weight. This information is not intended to replace advice given to you by your health care provider. Make sure you discuss any questions you have with your health care provider. Document Released: 09/18/2003 Document Revised: 05/30/2015 Document Reviewed: 11/12/2014 Elsevier Interactive Patient Education  2018 [employer].  Colonoscopy Discharge Instructions  Read the instructions outlined below and refer to this sheet in the next few weeks. These discharge instructions provide you with general information on caring  for yourself after you leave the hospital. Your doctor may also give you specific instructions. While your treatment has been planned according to the most current medical practices available, unavoidable complications occasionally occur. If you have any problems or questions after discharge, call Dr. Gala Romney at [phone]. ACTIVITY  You may resume your regular activity, but move at a slower pace for the next 24 hours.   Take frequent rest periods for the next 24 hours.   Walking will help get rid of the air and reduce the bloated feeling in your belly (abdomen).   No driving for 24 hours (because of the medicine (anesthesia) used during the test).    Do not sign any  important legal documents or operate any machinery for 24 hours (because of the anesthesia used during the test).  NUTRITION  Drink plenty of fluids.   You may resume your normal diet as instructed by your doctor.   Begin with a light meal and progress to your normal diet. Heavy or fried foods are harder to digest and may make you feel sick to your stomach (nauseated).   Avoid alcoholic beverages for 24 hours or as instructed.  MEDICATIONS  You may resume your normal medications unless your doctor tells you otherwise.  WHAT YOU CAN EXPECT TODAY  Some feelings of bloating in the abdomen.   Passage of more gas than usual.   Spotting of blood in your stool or on the toilet paper.  IF YOU HAD POLYPS REMOVED DURING THE COLONOSCOPY:  No aspirin products for 7 days or as instructed.   No alcohol for 7 days or as instructed.   Eat a soft diet for the next 24 hours.  FINDING OUT THE RESULTS OF YOUR TEST Not all test results are available during your visit. If your test results are not back during the visit, make an appointment with your caregiver to find out the results. Do not assume everything is normal if you have not heard from your caregiver or the medical facility. It is important for you to follow up on all of your test results.  SEEK IMMEDIATE MEDICAL ATTENTION IF:  You have more than a spotting of blood in your stool.   Your belly is swollen (abdominal distention).   You are nauseated or vomiting.   You have a temperature over 101.   You have abdominal pain or discomfort that is severe or gets worse throughout the day.    Colon polyp and diverticulosis information provided  Further recommendations to follow pending review of pathology report

## 2017-11-05 NOTE — Op Note (Signed)
[facility] Patient Name: Austin [name] Procedure Date: 11/05/2017 12:27 PM MRN: [id number] Date of Birth: 10-15-1955 Attending MD: Norvel Richards , MD CSN: [id number] Age: 63 Admit Type: Outpatient Procedure:                Colonoscopy Indications:              Screening for colorectal malignant neoplasm Providers:                Norvel Richards, MD, Otis Peak B. Sharon Seller, RN,                            Randa Spike, Technician Referring MD:              Medicines:                Midazolam 5 mg IV, Meperidine 50 mg IV Complications:            No immediate complications. Estimated Blood Loss:     Estimated blood loss was minimal. Procedure:                Pre-Anesthesia Assessment:                           - Prior to the procedure, a History and Physical                            was performed, and patient medications and                            allergies were reviewed. The patient's tolerance of                            previous anesthesia was also reviewed. The risks                            and benefits of the procedure and the sedation                            options and risks were discussed with the patient.                            All questions were answered, and informed consent                            was obtained. Prior Anticoagulants: The patient has                            taken no previous anticoagulant or antiplatelet                            agents. ASA Grade Assessment: II - A patient with                            mild systemic disease. After reviewing the risks  and benefits, the patient was deemed in                            satisfactory condition to undergo the procedure.                           After obtaining informed consent, the colonoscope                            was passed under direct vision. Throughout the                            procedure, the patient's blood pressure, pulse, and                             oxygen saturations were monitored continuously. The                            CF-HQ190L ([id number]) scope was introduced through                            the anus and advanced to the the cecum, identified                            by appendiceal orifice and ileocecal valve. The                            colonoscopy was performed without difficulty. The                            patient tolerated the procedure well. The quality                            of the bowel preparation was adequate. The                            ileocecal valve, appendiceal orifice, and rectum                            were photographed. The entire colon was well                            visualized. Scope In: 12:44:00 PM Scope Out: 12:57:41 PM Scope Withdrawal Time: 0 hours 9 minutes 48 seconds  Total Procedure Duration: 0 hours 13 minutes 41 seconds  Findings:      The perianal and digital rectal examinations were normal.      Scattered medium-mouthed diverticula were found in the sigmoid colon and       descending colon.      A 5 mm polyp was found in the hepatic flexure. The polyp was sessile.       The polyp was removed with a cold snare. Resection and retrieval were       complete. Estimated blood loss was minimal.      The exam was otherwise without abnormality on direct  and retroflexion       views. Impression:               - Diverticulosis in the sigmoid colon and in the                            descending colon.                           - One 5 mm polyp at the hepatic flexure, removed                            with a cold snare. Resected and retrieved.                           - The examination was otherwise normal on direct                            and retroflexion views. Moderate Sedation:      Moderate (conscious) sedation was administered by the endoscopy nurse       and supervised by the endoscopist. The following parameters were       monitored:  oxygen saturation, heart rate, blood pressure, respiratory       rate, EKG, adequacy of pulmonary ventilation, and response to care.       Total physician intraservice time was 19 minutes. Recommendation:           - Patient has a contact number available for                            emergencies. The signs and symptoms of potential                            delayed complications were discussed with the                            patient. Return to normal activities tomorrow.                            Written discharge instructions were provided to the                            patient.                           - Resume previous diet.                           - Continue present medications.                           - Repeat colonoscopy date to be determined after                            pending pathology results are reviewed for  surveillance based on pathology results.                           - Return to GI office (date not yet determined). Procedure Code(s):        --- Professional ---                           [phone], Colonoscopy, flexible; with removal of                            tumor(s), polyp(s), or other lesion(s) by snare                            technique                           G0500, Moderate sedation services provided by the                            same physician or other qualified health care                            professional performing a gastrointestinal                            endoscopic service that sedation supports,                            requiring the presence of an independent trained                            observer to assist in the monitoring of the                            patient's level of consciousness and physiological                            status; initial 15 minutes of intra-service time;                            patient age 30 years or older (additional time may                             be reported with [phone], as appropriate) Diagnosis Code(s):        --- Professional ---                           Z12.11, Encounter for screening for malignant                            neoplasm of colon                           D12.3, Benign neoplasm of transverse colon (hepatic  flexure or splenic flexure)                           K57.30, Diverticulosis of large intestine without                            perforation or abscess without bleeding CPT copyright 2018 American Medical Association. All rights reserved. The codes documented in this report are preliminary and upon coder review may  be revised to meet current compliance requirements. Cristopher Estimable. Karlina Suares, MD Norvel Richards, MD 11/05/2017 1:04:11 PM This report has been signed electronically. Number of Addenda: 0

## 2017-11-10 ENCOUNTER — Encounter (HOSPITAL_COMMUNITY): Payer: Self-pay | Admitting: Internal Medicine

## 2017-11-12 ENCOUNTER — Encounter: Payer: Self-pay | Admitting: Internal Medicine

## 2019-01-14 IMAGING — US US ABDOMEN COMPLETE
1 series · 14 of 25 positions shown · non-contrast
Comparison: None.

CLINICAL DATA: Abdominal pain.  Left flank pain.

EXAM:
ABDOMEN ULTRASOUND COMPLETE

[Series 1: us abdomen complete · 0.16mm/px · 14 of 131 slices shown]
[im 1/131]
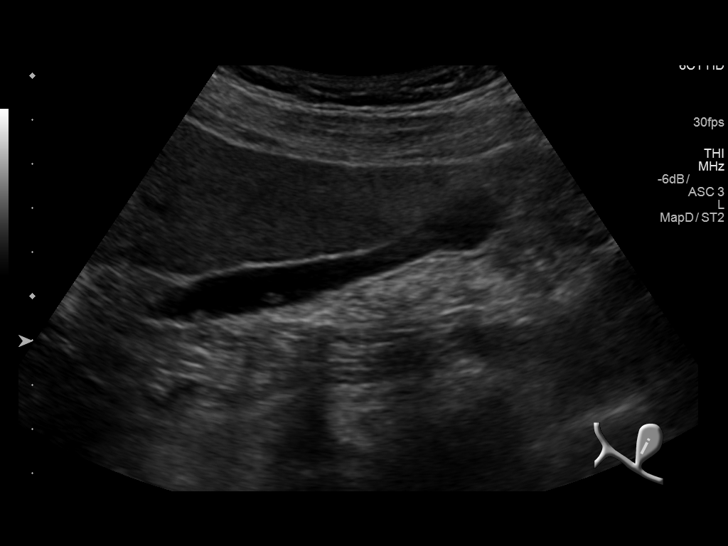
[im 11/131]
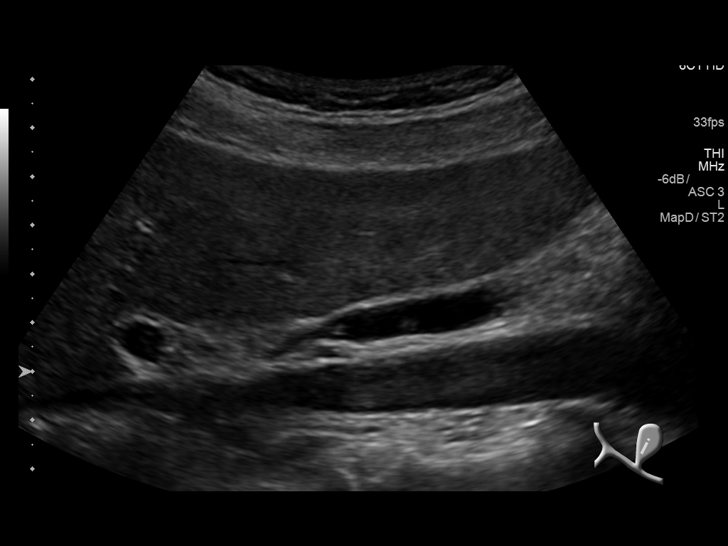
[im 22/131]
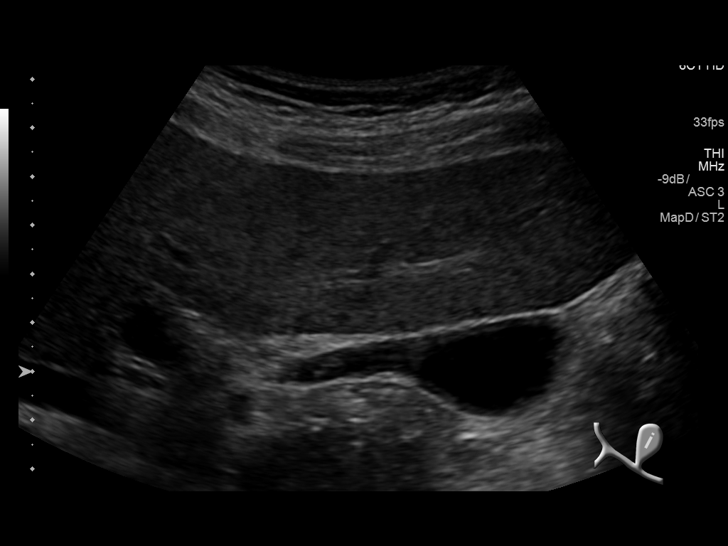
[im 33/131]
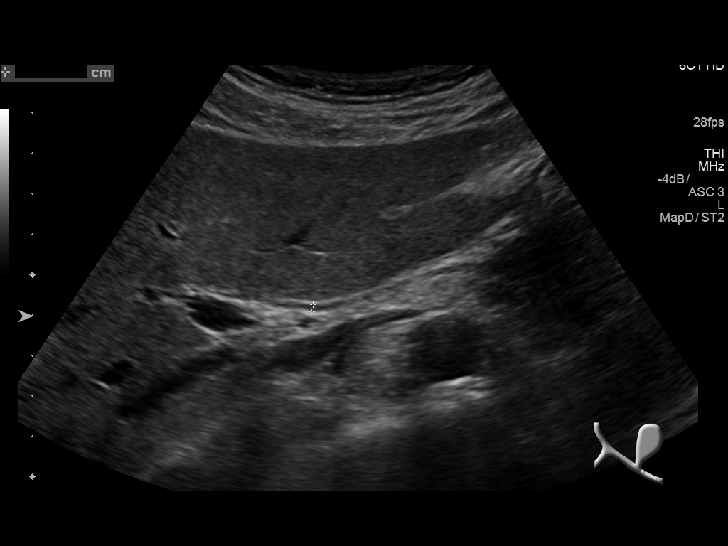
[im 44/131]
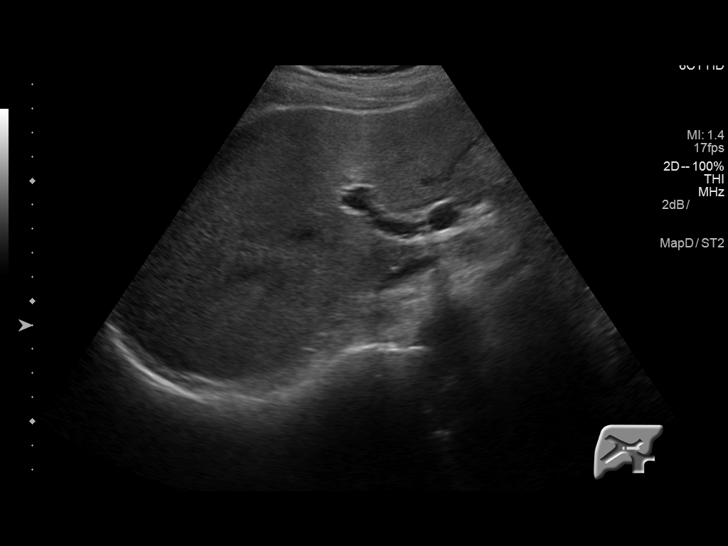
[im 49/131]
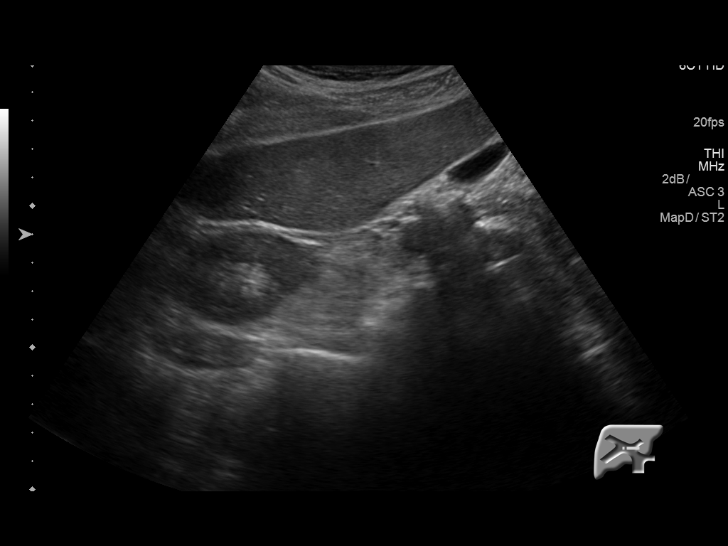
[im 60/131]
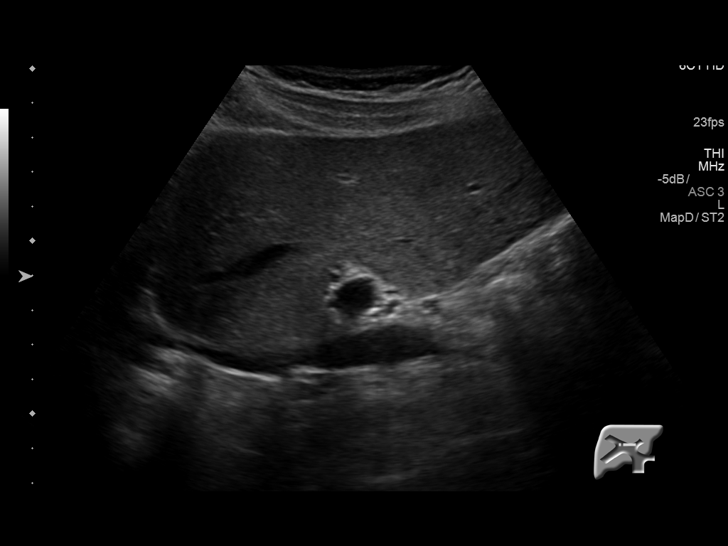
[im 71/131]
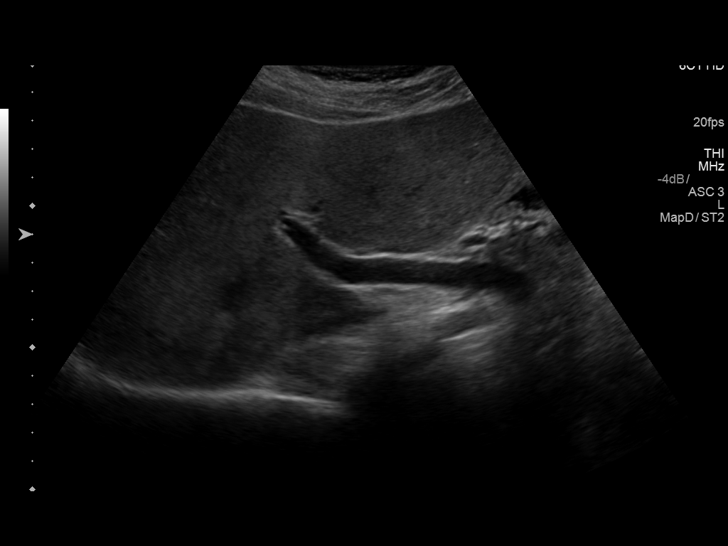
[im 82/131]
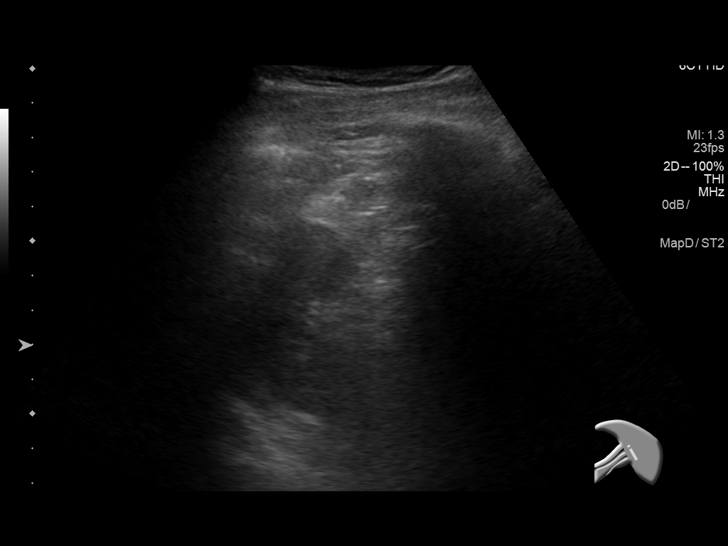
[im 87/131]
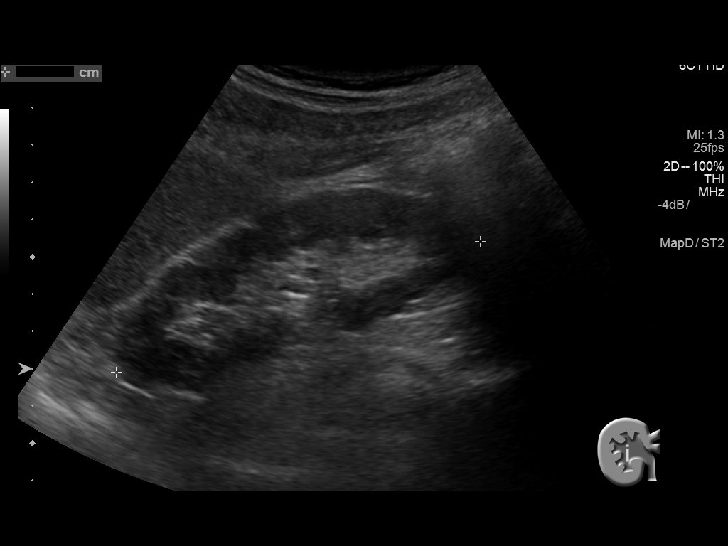
[im 98/131]
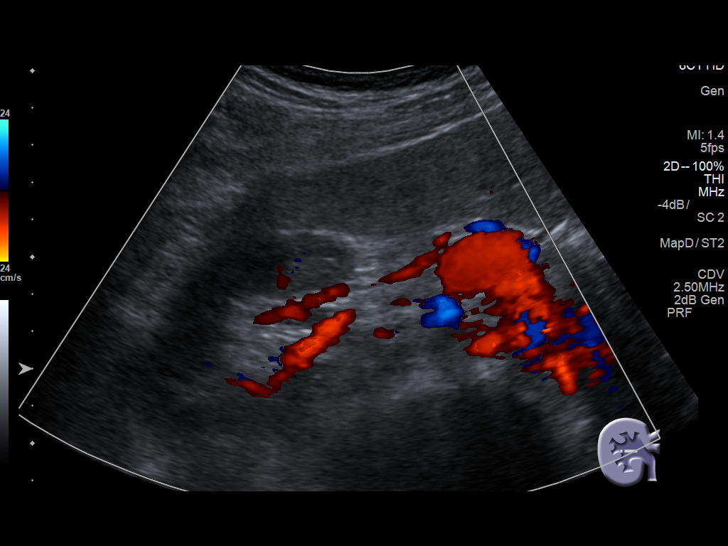
[im 109/131]
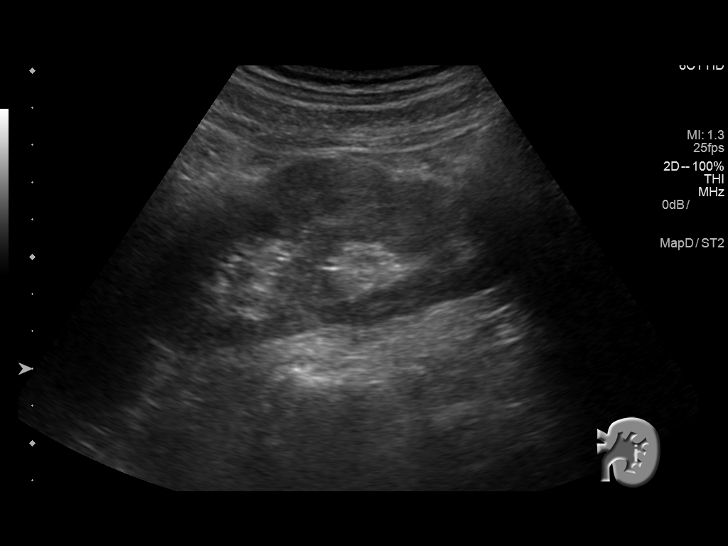
[im 120/131]
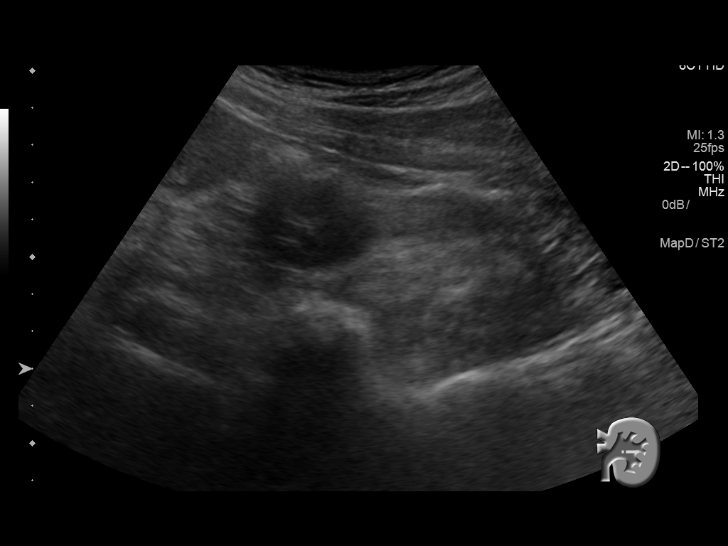
[im 131/131]
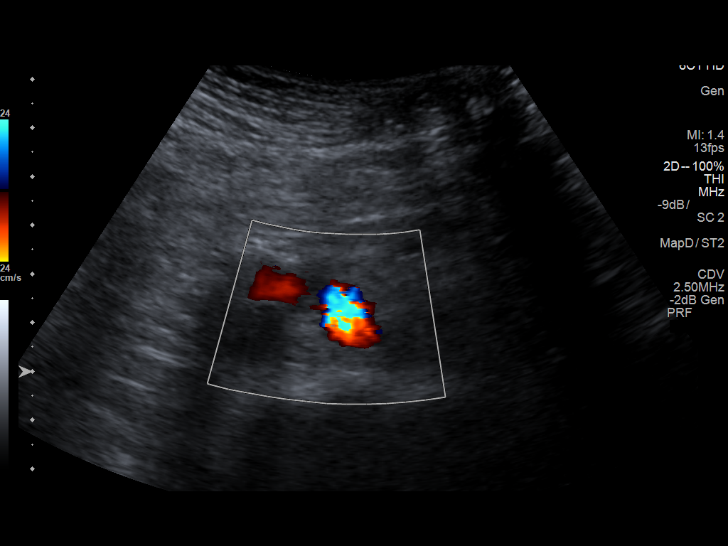

[14 of 25 positions shown; findings below may reference images not displayed]

FINDINGS: Gallbladder: There are multiple polyps in the gallbladder. The
largest measures 4 mm. No Murphy's sign, stones, sludge, or
pericholecystic fluid. The wall measures 3 mm.

Common bile duct: Diameter: 1.8 mm

Liver: No focal lesion identified. Within normal limits in
parenchymal echogenicity. Portal vein is patent on color Doppler
imaging with normal direction of blood flow towards the liver.

IVC: No abnormality visualized.

Pancreas: Visualized portion unremarkable.

Spleen: Size and appearance within normal limits.

Right Kidney: Length: 10.4 cm. Echogenicity within normal limits. No
mass or hydronephrosis visualized.

Left Kidney: Length: 10.4 cm. Echogenicity within normal limits. No
mass or hydronephrosis visualized.

Abdominal aorta: No aneurysm visualized.

Other findings: None.
IMPRESSION: 1. Multiple polyps in the gallbladder. The largest measures 4 mm,
not requiring follow-up.
2. The gallbladder wall is borderline in thickness. However, there
are no stones, sludge, pericholecystic fluid, or Murphy's sign.
3. No other abnormalities.

## 2019-08-24 ENCOUNTER — Other Ambulatory Visit: Payer: Self-pay | Admitting: Neurology

## 2019-08-24 ENCOUNTER — Other Ambulatory Visit (HOSPITAL_COMMUNITY): Payer: Self-pay | Admitting: Neurology

## 2019-08-24 DIAGNOSIS — G40219 Localization-related (focal) (partial) symptomatic epilepsy and epileptic syndromes with complex partial seizures, intractable, without status epilepticus: Secondary | ICD-10-CM

## 2019-08-24 DIAGNOSIS — G8191 Hemiplegia, unspecified affecting right dominant side: Secondary | ICD-10-CM

## 2019-09-06 ENCOUNTER — Ambulatory Visit (HOSPITAL_COMMUNITY): Payer: Medicaid Other

## 2019-09-26 ENCOUNTER — Other Ambulatory Visit: Payer: Self-pay | Admitting: Neurology

## 2019-09-26 DIAGNOSIS — G8190 Hemiplegia, unspecified affecting unspecified side: Secondary | ICD-10-CM

## 2019-10-02 ENCOUNTER — Ambulatory Visit (HOSPITAL_COMMUNITY)
Admission: RE | Admit: 2019-10-02 | Discharge: 2019-10-02 | Disposition: A | Discharge status: Home / Self Care | Payer: Medicare Other | Source: Ambulatory Visit | Attending: Neurology | Admitting: Neurology

## 2019-10-02 ENCOUNTER — Other Ambulatory Visit: Payer: Self-pay

## 2019-10-02 ENCOUNTER — Ambulatory Visit (HOSPITAL_COMMUNITY): Payer: Medicare Other

## 2019-10-02 DIAGNOSIS — G8190 Hemiplegia, unspecified affecting unspecified side: Secondary | ICD-10-CM | POA: Insufficient documentation

## 2019-10-04 ENCOUNTER — Ambulatory Visit (HOSPITAL_COMMUNITY): Payer: Medicare Other

## 2019-10-04 ENCOUNTER — Other Ambulatory Visit: Payer: Self-pay

## 2019-10-04 ENCOUNTER — Ambulatory Visit (HOSPITAL_COMMUNITY)
Admission: RE | Admit: 2019-10-04 | Discharge: 2019-10-04 | Disposition: A | Discharge status: Home / Self Care | Payer: Medicare Other | Source: Ambulatory Visit | Attending: Neurology | Admitting: Neurology

## 2019-10-04 DIAGNOSIS — Z79899 Other long term (current) drug therapy: Secondary | ICD-10-CM | POA: Diagnosis not present

## 2019-10-04 DIAGNOSIS — R410 Disorientation, unspecified: Secondary | ICD-10-CM | POA: Insufficient documentation

## 2019-10-04 NOTE — Progress Notes (Signed)
EEG complete - results pending 

## 2019-10-06 NOTE — Procedures (Signed)
  [place] 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  MEDICATIONS:  Current Outpatient Medications:  .  amLODipine (NORVASC) 5 MG tablet, Take 5 mg by mouth daily., Disp: , Rfl:  .  Na Sulfate-K Sulfate-Mg Sulf (SUPREP BOWEL PREP KIT) 17.5-3.13-1.6 GM/177ML SOLN, Take 1 kit by mouth as directed., Disp: 1 Bottle, Rfl: 0     ANALYSIS: A 16 channel recording using standard 10 20 measurements is conducted for 24 minutes.  There is the normal posterior dominant rhythm of 9 hertz which attenuates with eye opening. There is beta activity observed in frontal areas. Awake and drowsy architecture noted.  Occasional vertex sharp waves are noted.  Photic stimulation showed no abnormal changes. There is no focal or lateral slowing. There is no epileptiform activities noted.   IMPRESSION: 1.  This is a normal recording of awake and drowsy states.      [name] [name], M.D.  Diplomate, Tax adviser of Psychiatry and Neurology ( Neurology).

## 2019-11-09 ENCOUNTER — Encounter (HOSPITAL_COMMUNITY): Payer: Self-pay

## 2019-11-09 ENCOUNTER — Ambulatory Visit (HOSPITAL_COMMUNITY): Payer: Medicare Other | Attending: Neurology

## 2019-11-09 ENCOUNTER — Ambulatory Visit (HOSPITAL_COMMUNITY): Payer: Medicare Other

## 2020-11-12 ENCOUNTER — Other Ambulatory Visit (HOSPITAL_COMMUNITY): Payer: Self-pay | Admitting: Gerontology

## 2020-11-12 ENCOUNTER — Other Ambulatory Visit: Payer: Self-pay | Admitting: Gerontology

## 2020-11-12 DIAGNOSIS — Z87891 Personal history of nicotine dependence: Secondary | ICD-10-CM

## 2020-12-20 ENCOUNTER — Ambulatory Visit (HOSPITAL_COMMUNITY): Admission: RE | Admit: 2020-12-20 | Payer: Medicare Other | Source: Ambulatory Visit

## 2020-12-20 ENCOUNTER — Encounter (HOSPITAL_COMMUNITY): Payer: Self-pay

## 2021-02-03 IMAGING — US US CAROTID DUPLEX BILAT
1 series · 13 of 24 positions shown · non-contrast
Comparison: None.

CLINICAL DATA: Hemiplegia, hypertension and syncope.

EXAM:
BILATERAL CAROTID DUPLEX ULTRASOUND
TECHNIQUE: Gray scale imaging, color Doppler and duplex ultrasound were
performed of bilateral carotid and vertebral arteries in the neck.

[Series 1: us carotid bilateral · 13 of 68 slices shown]
[im 1/68]
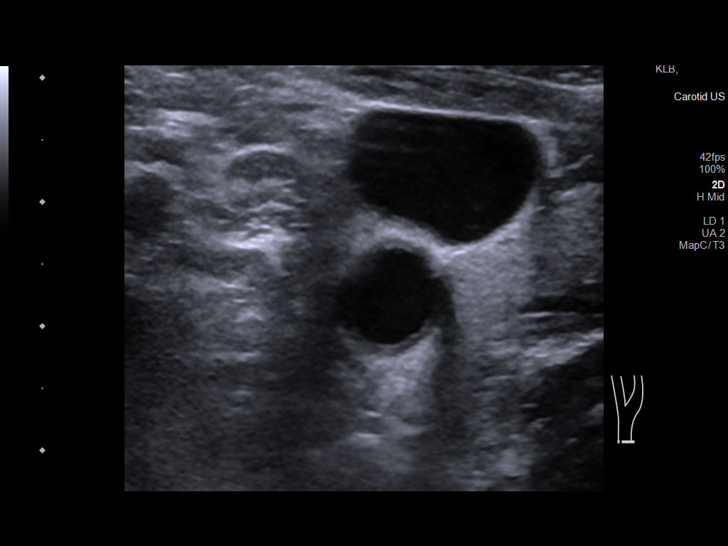
[im 6/68]
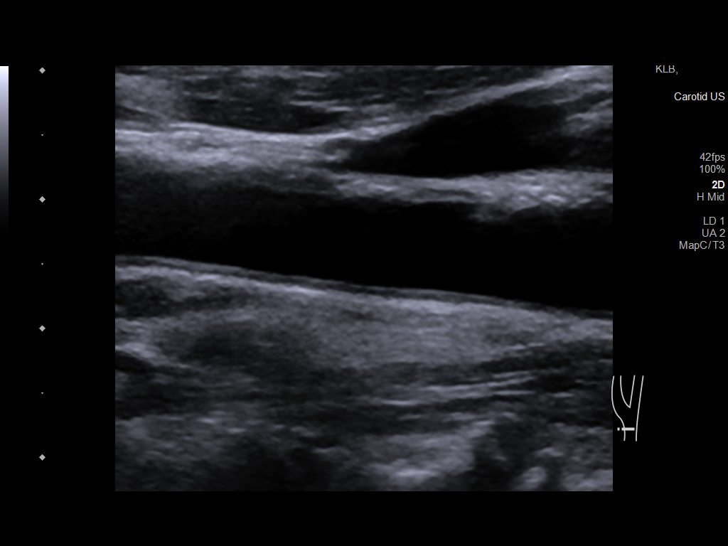
[im 12/68]
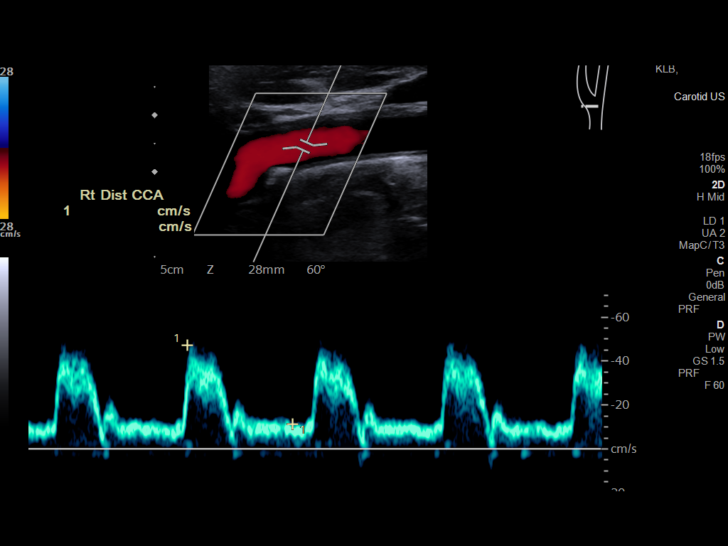
[im 18/68]
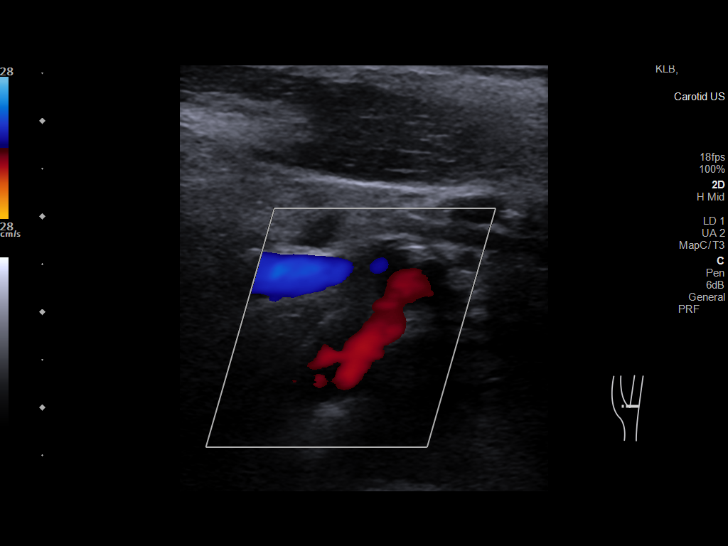
[im 24/68]
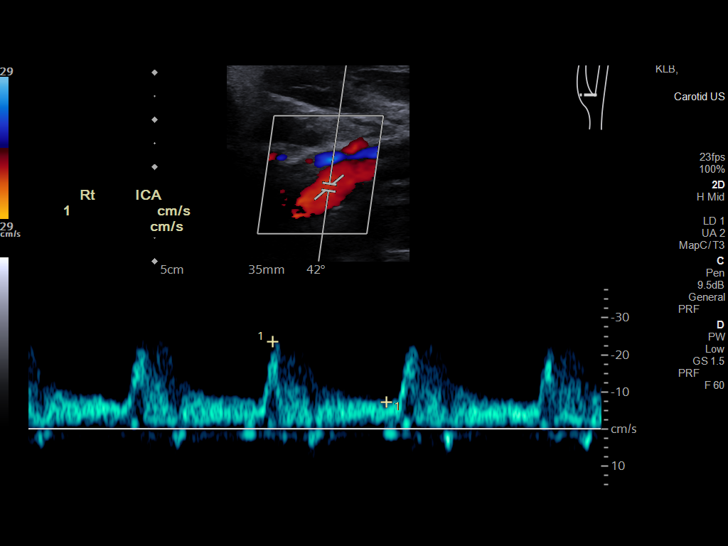
[im 30/68]
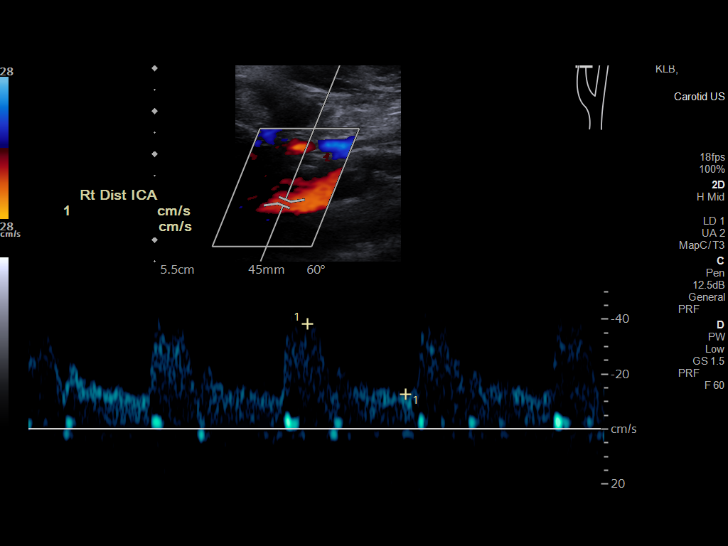
[im 35/68]
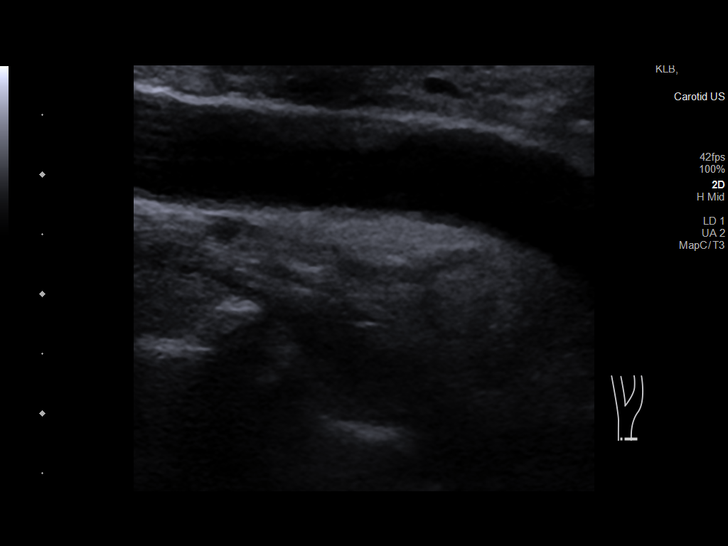
[im 38/68]
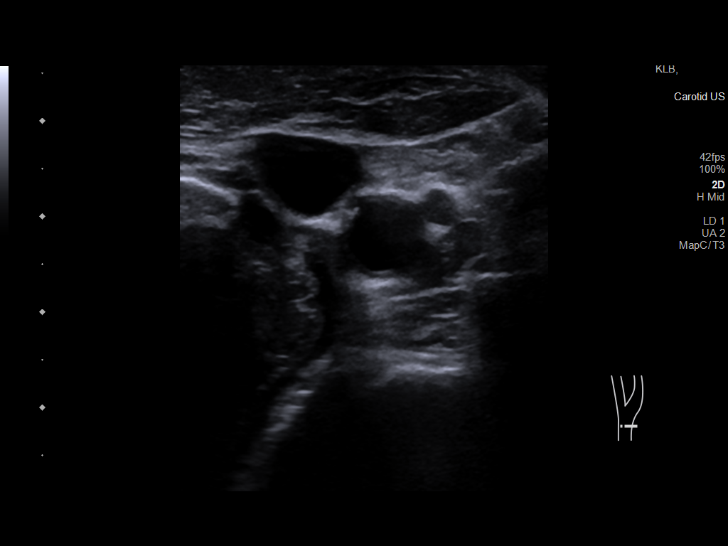
[im 44/68]
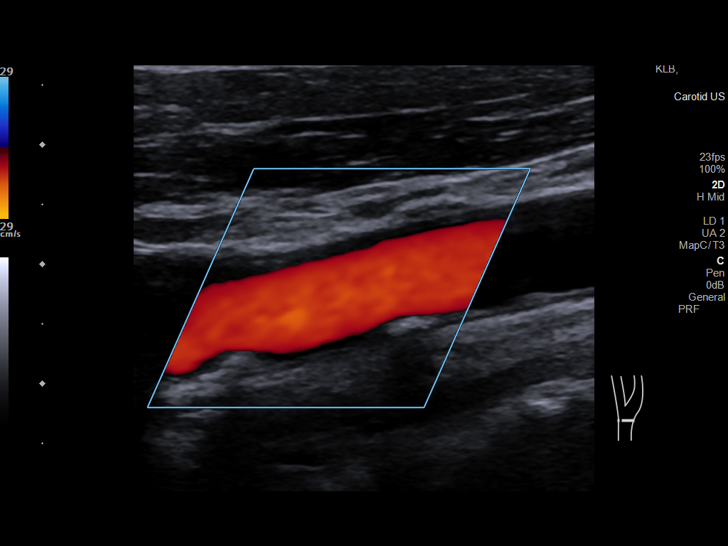
[im 50/68]
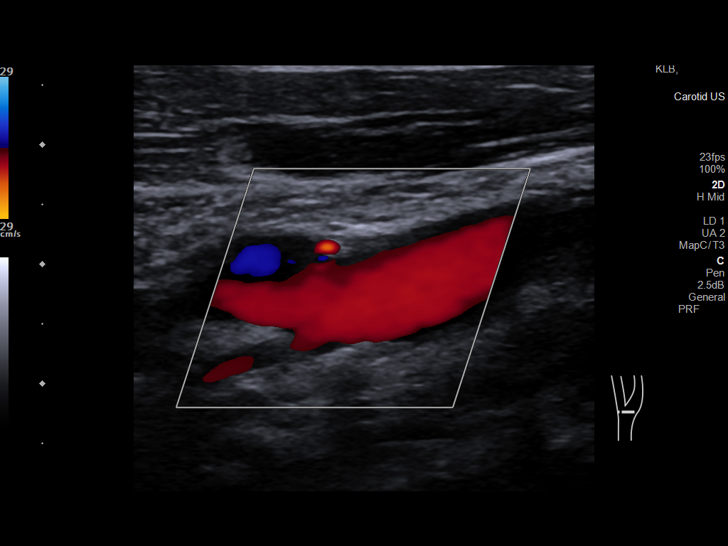
[im 56/68]
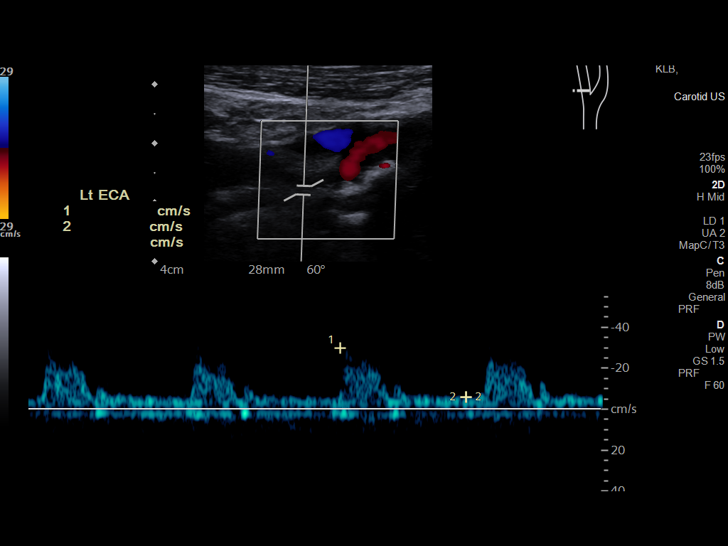
[im 62/68]
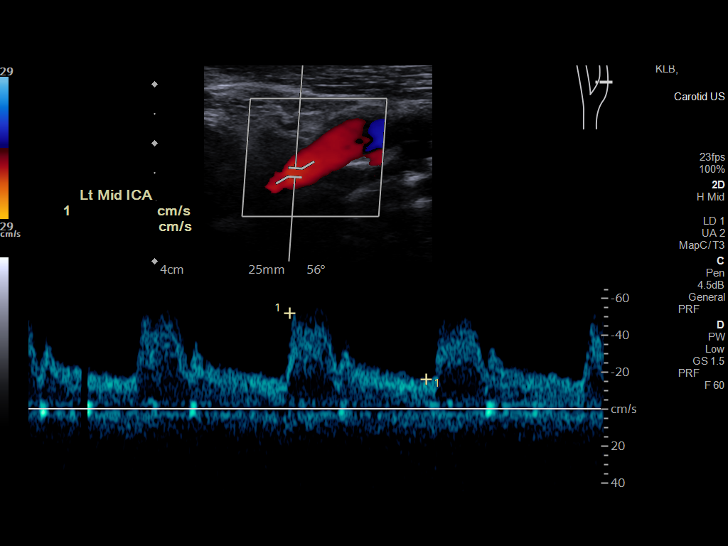
[im 68/68]
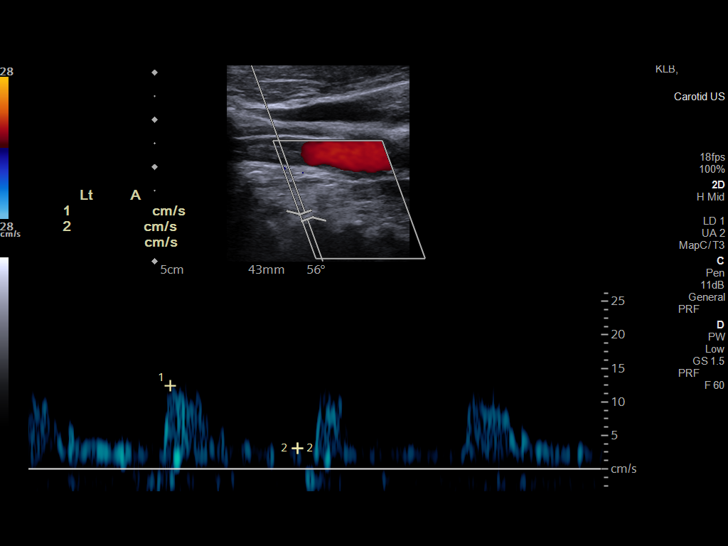

[13 of 24 positions shown; findings below may reference images not displayed]

FINDINGS: Criteria: Quantification of carotid stenosis is based on velocity
parameters that correlate the residual internal carotid diameter
with NASCET-based stenosis levels, using the diameter of the distal
internal carotid lumen as the denominator for stenosis measurement.

The following velocity measurements were obtained:

RIGHT

ICA:  44/14 cm/sec

CCA:  49/10 cm/sec

SYSTOLIC ICA/CCA RATIO:

ECA:  59 cm/sec

LEFT

ICA:  52/16 cm/sec

CCA:  43/8 cm/sec

SYSTOLIC ICA/CCA RATIO:

ECA:  24 cm/sec

RIGHT CAROTID ARTERY: Mild amount of calcified plaque is present at
the level of the carotid bulb. No evidence of right ICA plaque or
stenosis.

RIGHT VERTEBRAL ARTERY: Antegrade flow with normal waveform and
velocity.

LEFT CAROTID ARTERY: Mild amount of calcified plaque is present at
the level of the distal common carotid artery common carotid bulb
and proximal left ICA. Estimated left ICA stenosis is less than 50%.

LEFT VERTEBRAL ARTERY: Antegrade flow with normal waveform and
velocity.
IMPRESSION: 1. Mild amount of plaque at the level of the left carotid
bifurcation including the proximal left ICA. Estimated left ICA
stenosis is less than 50%.
2. Mild calcified plaque at the level of the right carotid bulb. No
evidence of right ICA plaque or stenosis.

## 2021-07-19 ENCOUNTER — Encounter (HOSPITAL_COMMUNITY): Payer: Self-pay | Admitting: Emergency Medicine

## 2021-07-19 ENCOUNTER — Other Ambulatory Visit: Payer: Self-pay

## 2021-07-19 ENCOUNTER — Emergency Department (HOSPITAL_COMMUNITY)
Admission: EM | Admit: 2021-07-19 | Discharge: 2021-07-19 | Disposition: A | Discharge status: Home / Self Care | Payer: Medicare Other | Attending: Emergency Medicine | Admitting: Emergency Medicine

## 2021-07-19 DIAGNOSIS — R55 Syncope and collapse: Secondary | ICD-10-CM | POA: Insufficient documentation

## 2021-07-19 DIAGNOSIS — R404 Transient alteration of awareness: Secondary | ICD-10-CM

## 2021-07-19 DIAGNOSIS — N289 Disorder of kidney and ureter, unspecified: Secondary | ICD-10-CM | POA: Diagnosis not present

## 2021-07-19 DIAGNOSIS — R410 Disorientation, unspecified: Secondary | ICD-10-CM | POA: Diagnosis not present

## 2021-07-19 DIAGNOSIS — R4189 Other symptoms and signs involving cognitive functions and awareness: Secondary | ICD-10-CM

## 2021-07-19 DIAGNOSIS — R464 Slowness and poor responsiveness: Secondary | ICD-10-CM | POA: Insufficient documentation

## 2021-07-19 DIAGNOSIS — Z79899 Other long term (current) drug therapy: Secondary | ICD-10-CM | POA: Diagnosis not present

## 2021-07-19 LAB — CBC WITH DIFFERENTIAL/PLATELET
Abs Immature Granulocytes: 0.06 10*3/uL (ref 0.00–0.07)
Basophils Absolute: 0 10*3/uL (ref 0.0–0.1)
Basophils Relative: 0 %
Eosinophils Absolute: 0.3 10*3/uL (ref 0.0–0.5)
Eosinophils Relative: 3 %
HCT: 47 % (ref 39.0–52.0)
Hemoglobin: 15.4 g/dL (ref 13.0–17.0)
Immature Granulocytes: 1 %
Lymphocytes Relative: 21 %
Lymphs Abs: 1.9 10*3/uL (ref 0.7–4.0)
MCH: 30.4 pg (ref 26.0–34.0)
MCHC: 32.8 g/dL (ref 30.0–36.0)
MCV: 92.7 fL (ref 80.0–100.0)
Monocytes Absolute: 0.7 10*3/uL (ref 0.1–1.0)
Monocytes Relative: 8 %
Neutro Abs: 6.3 10*3/uL (ref 1.7–7.7)
Neutrophils Relative %: 67 %
Platelets: 305 10*3/uL (ref 150–400)
RBC: 5.07 MIL/uL (ref 4.22–5.81)
RDW: 15.9 % — ABNORMAL HIGH (ref 11.5–15.5)
WBC: 9.4 10*3/uL (ref 4.0–10.5)
nRBC: 0 % (ref 0.0–0.2)

## 2021-07-19 LAB — BASIC METABOLIC PANEL
Anion gap: 6 (ref 5–15)
BUN: 18 mg/dL (ref 8–23)
CO2: 26 mmol/L (ref 22–32)
Calcium: 9 mg/dL (ref 8.9–10.3)
Chloride: 112 mmol/L — ABNORMAL HIGH (ref 98–111)
Creatinine, Ser: 1.36 mg/dL — ABNORMAL HIGH (ref 0.61–1.24)
GFR, Estimated: 57 mL/min — ABNORMAL LOW (ref >60)
Glucose, Bld: 92 mg/dL (ref 70–99)
Potassium: 4.1 mmol/L (ref 3.5–5.1)
Sodium: 144 mmol/L (ref 135–145)

## 2021-07-19 MED ORDER — SODIUM CHLORIDE 0.9 % IV BOLUS
500.0000 mL | Freq: Once | INTRAVENOUS | Status: AC
  Administered 2021-07-19: 500 mL via INTRAVENOUS

## 2021-07-19 MED ORDER — SODIUM CHLORIDE 0.9 % IV SOLN
INTRAVENOUS | Status: DC
  Administered 2021-07-19: 500 mL via INTRAVENOUS

## 2021-07-19 NOTE — ED Triage Notes (Addendum)
Patient brought in via EMS from home. Alert and oriented x3 confused to time/date. Airway patent. Paramedics called by neighbors because patient was sound sitting in chair on front porch but was not responsive to them. Patient became alert as paramedics were in route and neighbor was on phone with [employer]. Patient denies any neurological deficits, headache, dizziness, or pain. Patient does report drinking a beer. Bottles noted near patient per paramedics. CBG 131 in route, EKG NSR, and VSS per paramedic.

## 2021-07-19 NOTE — Discharge Instructions (Signed)
Work-up here today without any acute findings.  Make sure you hydrate yourself well.  Renal function a little bit worse than it was in past years.  Would recommend he follow-up with your primary care doctor to have that rechecked.  Return for any new or worse symptoms.  To include passing out.

## 2021-07-19 NOTE — ED Provider Notes (Signed)
[facility] EMERGENCY DEPARTMENT Provider Note   CSN: [id number] Arrival date & time: 07/19/21  1530     History  Chief Complaint  Patient presents with   Loss of Consciousness    Austin [name] is a 66 y.o. male.  Patient brought in by EMS because well [place] from home.  Patient is alert and oriented x3 but confused to the year.  Apparently paramedics called by neighbors because patient was sitting in chair on front porch and was not responsive to them.  Patient became alert as paramedics were in route neighbor was on the phone with CT,.  Patient denied any neurological deficits headache dizziness or pain patient does report drinking beer and there was bottles near the patient on the porch.  Blood sugar was 131.  Patient was very stable in route.  Patient lives by himself.  Patient completely without any complaints at this time.  He thinks he just fell asleep.  Past medical history is significant for dyspnea.  Patient is a current smoker.  States he drinks 2 cans of beer per day.  Also admits to using marijuana.  Last seen in 2012.       Home Medications Prior to Admission medications   Medication Sig Start Date End Date Taking? Authorizing Provider  amLODipine (NORVASC) 5 MG tablet Take 5 mg by mouth daily.    [provider]  Na Sulfate-K Sulfate-Mg Sulf (SUPREP BOWEL PREP KIT) 17.5-3.13-1.6 GM/177ML SOLN Take 1 kit by mouth as directed. 09/23/17   Annitta Needs, NP      Allergies    Fish allergy    Review of Systems   Review of Systems  Constitutional:  Negative for chills and fever.  HENT:  Negative for ear pain and sore throat.   Eyes:  Negative for pain and visual disturbance.  Respiratory:  Negative for cough and shortness of breath.   Cardiovascular:  Negative for chest pain and palpitations.  Gastrointestinal:  Negative for abdominal pain and vomiting.  Genitourinary:  Negative for dysuria and hematuria.  Musculoskeletal:  Negative for arthralgias and  back pain.  Skin:  Negative for color change and rash.  Neurological:  Negative for dizziness, tremors, seizures, syncope, facial asymmetry, speech difficulty, weakness, light-headedness, numbness and headaches.  All other systems reviewed and are negative.   Physical Exam Updated Vital Signs BP 133/81 (BP Location: Right Arm)   Pulse 64   Temp (!) 97.5 F (36.4 C) (Oral)   Resp 18   Ht 1.765 m (5' 9.5")   Wt 70.3 kg   SpO2 98%   BMI 22.56 kg/m  Physical Exam Vitals and nursing note reviewed.  Constitutional:      General: He is not in acute distress.    Appearance: Normal appearance. He is well-developed.  HENT:     Head: Normocephalic and atraumatic.  Eyes:     Extraocular Movements: Extraocular movements intact.     Conjunctiva/sclera: Conjunctivae normal.     Pupils: Pupils are equal, round, and reactive to light.  Cardiovascular:     Rate and Rhythm: Normal rate and regular rhythm.     Heart sounds: No murmur heard. Pulmonary:     Effort: Pulmonary effort is normal. No respiratory distress.     Breath sounds: Normal breath sounds.  Abdominal:     Palpations: Abdomen is soft.     Tenderness: There is no abdominal tenderness.  Musculoskeletal:        General: No swelling.  Cervical back: Normal range of motion and neck supple.  Skin:    General: Skin is warm and dry.     Capillary Refill: Capillary refill takes less than 2 seconds.  Neurological:     General: No focal deficit present.     Mental Status: He is alert.     Cranial Nerves: No cranial nerve deficit.     Sensory: No sensory deficit.     Motor: No weakness.     Comments: Oriented except for confused about the year thinks is 2022  Psychiatric:        Mood and Affect: Mood normal.     ED Results / Procedures / Treatments   Labs (all labs ordered are listed, but only abnormal results are displayed) Labs Reviewed  BASIC METABOLIC PANEL - Abnormal; Notable for the following components:       Result Value   Chloride 112 (*)    Creatinine, Ser 1.36 (*)    GFR, Estimated 57 (*)    All other components within normal limits  CBC WITH DIFFERENTIAL/PLATELET - Abnormal; Notable for the following components:   RDW 15.9 (*)    All other components within normal limits    EKG EKG Interpretation  Date/Time:  Saturday July 19 2021 16:45:39 EDT Ventricular Rate:  68 PR Interval:  163 QRS Duration: 90 QT Interval:  446 QTC Calculation: 475 R Axis:   18 Text Interpretation: Sinus rhythm Atrial premature complexes in couplets Anteroseptal infarct, old Borderline repolarization abnormality Confirmed by Fredia Sorrow [phone]) on 07/19/2021 4:56:48 PM  Radiology No results found.  Procedures Procedures    Medications Ordered in ED Medications  0.9 %  sodium chloride infusion (500 mLs Intravenous New Bag/Given 07/19/21 1725)  sodium chloride 0.9 % bolus 500 mL (500 mLs Intravenous New Bag/Given 07/19/21 1724)    ED Course/ Medical Decision Making/ A&P                           Medical Decision Making Amount and/or Complexity of Data Reviewed Labs: ordered.  Risk Prescription drug management.   Patient here hemodynamically stable patient's basic labs without significant abnormalities other than some renal insufficiency.  With a GFR in the 50s.  Potassium is normal.  CBC normal no leukocytosis hemoglobin normal platelets normal.  EKG without any acute findings had a bit of premature atrial contraction.  Had that in the past.  Patient thinks he fell asleep.  Does admit to drinking beer.  Does not seem significantly dehydrated.  Cardiac monitoring here has been stable.  Patient stable for discharge home.   Final Clinical Impression(s) / ED Diagnoses Final diagnoses:  Unresponsive episode  Renal insufficiency    Rx / DC Orders ED Discharge Orders     None         Fredia Sorrow, MD 07/19/21 1726

## 2021-07-19 NOTE — ED Notes (Signed)
Daughter, Jonelle Sidle, would like any updates regarding changes in patient's condition or concerning test results. Verbal permission given by patient to talk to daughter. Daughter can best be contacted at [phone].

## 2021-07-22 DIAGNOSIS — L89892 Pressure ulcer of other site, stage 2: Secondary | ICD-10-CM | POA: Diagnosis present

## 2021-07-22 DIAGNOSIS — Z87891 Personal history of nicotine dependence: Secondary | ICD-10-CM

## 2021-07-22 DIAGNOSIS — E876 Hypokalemia: Secondary | ICD-10-CM | POA: Diagnosis present

## 2021-07-22 DIAGNOSIS — Z79899 Other long term (current) drug therapy: Secondary | ICD-10-CM

## 2021-07-22 DIAGNOSIS — R000 Tachycardia, unspecified: Secondary | ICD-10-CM | POA: Diagnosis not present

## 2021-07-22 DIAGNOSIS — F10229 Alcohol dependence with intoxication, unspecified: Secondary | ICD-10-CM | POA: Diagnosis present

## 2021-07-22 DIAGNOSIS — F1027 Alcohol dependence with alcohol-induced persisting dementia: Secondary | ICD-10-CM | POA: Diagnosis present

## 2021-07-22 DIAGNOSIS — E860 Dehydration: Secondary | ICD-10-CM | POA: Diagnosis present

## 2021-07-22 DIAGNOSIS — S72011A Unspecified intracapsular fracture of right femur, initial encounter for closed fracture: Principal | ICD-10-CM | POA: Diagnosis present

## 2021-07-22 DIAGNOSIS — I10 Essential (primary) hypertension: Secondary | ICD-10-CM | POA: Diagnosis present

## 2021-07-22 DIAGNOSIS — W19XXXA Unspecified fall, initial encounter: Secondary | ICD-10-CM | POA: Diagnosis present

## 2021-07-22 DIAGNOSIS — Z91013 Allergy to seafood: Secondary | ICD-10-CM

## 2021-07-22 DIAGNOSIS — F1010 Alcohol abuse, uncomplicated: Secondary | ICD-10-CM | POA: Diagnosis not present

## 2021-07-22 DIAGNOSIS — Z8249 Family history of ischemic heart disease and other diseases of the circulatory system: Secondary | ICD-10-CM

## 2021-07-23 ENCOUNTER — Encounter (HOSPITAL_COMMUNITY): Payer: Self-pay | Admitting: Emergency Medicine

## 2021-07-23 ENCOUNTER — Inpatient Hospital Stay (HOSPITAL_COMMUNITY): Payer: Medicare Other

## 2021-07-23 ENCOUNTER — Emergency Department (HOSPITAL_COMMUNITY): Payer: Medicare Other

## 2021-07-23 ENCOUNTER — Other Ambulatory Visit: Payer: Self-pay

## 2021-07-23 ENCOUNTER — Inpatient Hospital Stay (HOSPITAL_COMMUNITY)
Admission: EM | Admit: 2021-07-23 | Discharge: 2021-07-28 | DRG: 481 | Disposition: A | Discharge status: Skilled Nursing Facility | Payer: Medicare Other | Attending: Family Medicine | Admitting: Family Medicine

## 2021-07-23 DIAGNOSIS — Z8249 Family history of ischemic heart disease and other diseases of the circulatory system: Secondary | ICD-10-CM | POA: Diagnosis not present

## 2021-07-23 DIAGNOSIS — E876 Hypokalemia: Secondary | ICD-10-CM | POA: Diagnosis present

## 2021-07-23 DIAGNOSIS — F1010 Alcohol abuse, uncomplicated: Secondary | ICD-10-CM | POA: Diagnosis present

## 2021-07-23 DIAGNOSIS — S72001A Fracture of unspecified part of neck of right femur, initial encounter for closed fracture: Secondary | ICD-10-CM | POA: Diagnosis not present

## 2021-07-23 DIAGNOSIS — I10 Essential (primary) hypertension: Secondary | ICD-10-CM | POA: Diagnosis present

## 2021-07-23 DIAGNOSIS — S72011A Unspecified intracapsular fracture of right femur, initial encounter for closed fracture: Secondary | ICD-10-CM | POA: Diagnosis present

## 2021-07-23 DIAGNOSIS — L89892 Pressure ulcer of other site, stage 2: Secondary | ICD-10-CM | POA: Diagnosis present

## 2021-07-23 DIAGNOSIS — Z79899 Other long term (current) drug therapy: Secondary | ICD-10-CM | POA: Diagnosis not present

## 2021-07-23 DIAGNOSIS — W19XXXA Unspecified fall, initial encounter: Secondary | ICD-10-CM | POA: Diagnosis present

## 2021-07-23 DIAGNOSIS — F1027 Alcohol dependence with alcohol-induced persisting dementia: Secondary | ICD-10-CM | POA: Diagnosis present

## 2021-07-23 DIAGNOSIS — Z87891 Personal history of nicotine dependence: Secondary | ICD-10-CM | POA: Diagnosis not present

## 2021-07-23 DIAGNOSIS — D649 Anemia, unspecified: Secondary | ICD-10-CM | POA: Diagnosis not present

## 2021-07-23 DIAGNOSIS — S72044A Nondisplaced fracture of base of neck of right femur, initial encounter for closed fracture: Secondary | ICD-10-CM | POA: Diagnosis not present

## 2021-07-23 DIAGNOSIS — R000 Tachycardia, unspecified: Secondary | ICD-10-CM | POA: Diagnosis not present

## 2021-07-23 DIAGNOSIS — L8990 Pressure ulcer of unspecified site, unspecified stage: Secondary | ICD-10-CM | POA: Insufficient documentation

## 2021-07-23 DIAGNOSIS — E860 Dehydration: Secondary | ICD-10-CM | POA: Diagnosis present

## 2021-07-23 DIAGNOSIS — F1020 Alcohol dependence, uncomplicated: Secondary | ICD-10-CM | POA: Diagnosis present

## 2021-07-23 DIAGNOSIS — F10229 Alcohol dependence with intoxication, unspecified: Secondary | ICD-10-CM | POA: Diagnosis present

## 2021-07-23 DIAGNOSIS — Z91013 Allergy to seafood: Secondary | ICD-10-CM | POA: Diagnosis not present

## 2021-07-23 LAB — URINALYSIS, ROUTINE W REFLEX MICROSCOPIC
Bilirubin Urine: NEGATIVE
Glucose, UA: NEGATIVE mg/dL
Hgb urine dipstick: NEGATIVE
Ketones, ur: NEGATIVE mg/dL
Leukocytes,Ua: NEGATIVE
Nitrite: NEGATIVE
Protein, ur: 30 mg/dL — AB
Specific Gravity, Urine: 1.019 (ref 1.005–1.030)
pH: 5 (ref 5.0–8.0)

## 2021-07-23 LAB — CBC WITH DIFFERENTIAL/PLATELET
Abs Immature Granulocytes: 0.06 10*3/uL (ref 0.00–0.07)
Basophils Absolute: 0 10*3/uL (ref 0.0–0.1)
Basophils Relative: 0 %
Eosinophils Absolute: 0 10*3/uL (ref 0.0–0.5)
Eosinophils Relative: 0 %
HCT: 47.6 % (ref 39.0–52.0)
Hemoglobin: 15.8 g/dL (ref 13.0–17.0)
Immature Granulocytes: 1 %
Lymphocytes Relative: 15 %
Lymphs Abs: 2 10*3/uL (ref 0.7–4.0)
MCH: 30.6 pg (ref 26.0–34.0)
MCHC: 33.2 g/dL (ref 30.0–36.0)
MCV: 92.2 fL (ref 80.0–100.0)
Monocytes Absolute: 1.2 10*3/uL — ABNORMAL HIGH (ref 0.1–1.0)
Monocytes Relative: 9 %
Neutro Abs: 9.8 10*3/uL — ABNORMAL HIGH (ref 1.7–7.7)
Neutrophils Relative %: 75 %
Platelets: 281 10*3/uL (ref 150–400)
RBC: 5.16 MIL/uL (ref 4.22–5.81)
RDW: 15.9 % — ABNORMAL HIGH (ref 11.5–15.5)
WBC: 13 10*3/uL — ABNORMAL HIGH (ref 4.0–10.5)
nRBC: 0 % (ref 0.0–0.2)

## 2021-07-23 LAB — PHOSPHORUS: Phosphorus: 3.5 mg/dL (ref 2.5–4.6)

## 2021-07-23 LAB — BASIC METABOLIC PANEL
Anion gap: 7 (ref 5–15)
BUN: 13 mg/dL (ref 8–23)
CO2: 28 mmol/L (ref 22–32)
Calcium: 9 mg/dL (ref 8.9–10.3)
Chloride: 109 mmol/L (ref 98–111)
Creatinine, Ser: 1.18 mg/dL (ref 0.61–1.24)
GFR, Estimated: >60 mL/min (ref >60)
Glucose, Bld: 115 mg/dL — ABNORMAL HIGH (ref 70–99)
Potassium: 3.4 mmol/L — ABNORMAL LOW (ref 3.5–5.1)
Sodium: 144 mmol/L (ref 135–145)

## 2021-07-23 LAB — HIV ANTIBODY (ROUTINE TESTING W REFLEX): HIV Screen 4th Generation wRfx: NONREACTIVE

## 2021-07-23 LAB — MAGNESIUM: Magnesium: 2.6 mg/dL — ABNORMAL HIGH (ref 1.7–2.4)

## 2021-07-23 MED ORDER — ACETAMINOPHEN 650 MG RE SUPP: 650.0000 mg | Freq: Four times a day (QID) | RECTAL | Status: DC | PRN

## 2021-07-23 MED ORDER — SODIUM CHLORIDE 0.9% FLUSH
3.0000 mL | Freq: Two times a day (BID) | INTRAVENOUS | Status: DC
  Administered 2021-07-23 (×2): 3 mL via INTRAVENOUS

## 2021-07-23 MED ORDER — THIAMINE HCL 100 MG PO TABS
100.0000 mg | ORAL_TABLET | Freq: Every day | ORAL | Status: DC
Start: 2021-07-23 — End: 2021-07-28
  Administered 2021-07-23 – 2021-07-28 (×5): 100 mg via ORAL
  Filled 2021-07-23 (×5): qty 1

## 2021-07-23 MED ORDER — AMLODIPINE BESYLATE 5 MG PO TABS
10.0000 mg | ORAL_TABLET | Freq: Every day | ORAL | Status: DC
  Administered 2021-07-23 – 2021-07-28 (×5): 10 mg via ORAL
  Filled 2021-07-23 (×6): qty 2

## 2021-07-23 MED ORDER — OXYCODONE HCL 5 MG PO TABS: 5.0000 mg | ORAL_TABLET | ORAL | Status: DC | PRN

## 2021-07-23 MED ORDER — ONDANSETRON HCL 4 MG/2ML IJ SOLN: 4.0000 mg | Freq: Four times a day (QID) | INTRAMUSCULAR | Status: DC | PRN

## 2021-07-23 MED ORDER — SODIUM CHLORIDE 0.9% FLUSH: 3.0000 mL | INTRAVENOUS | Status: DC | PRN

## 2021-07-23 MED ORDER — SODIUM CHLORIDE 0.9 % IV SOLN
INTRAVENOUS | Status: DC
Start: 2021-07-23 — End: 2021-07-23

## 2021-07-23 MED ORDER — ADULT MULTIVITAMIN W/MINERALS CH
1.0000 | ORAL_TABLET | Freq: Every day | ORAL | Status: DC
Start: 2021-07-23 — End: 2021-07-28
  Administered 2021-07-25 – 2021-07-28 (×4): 1 via ORAL
  Filled 2021-07-23 (×5): qty 1

## 2021-07-23 MED ORDER — IPRATROPIUM BROMIDE 0.02 % IN SOLN: 0.5000 mg | Freq: Four times a day (QID) | RESPIRATORY_TRACT | Status: DC | PRN

## 2021-07-23 MED ORDER — FLEET ENEMA 7-19 GM/118ML RE ENEM: 1.0000 | ENEMA | Freq: Once | RECTAL | Status: DC | PRN

## 2021-07-23 MED ORDER — HYDRALAZINE HCL 20 MG/ML IJ SOLN: 10.0000 mg | INTRAMUSCULAR | Status: DC | PRN

## 2021-07-23 MED ORDER — LACTATED RINGERS IV SOLN: INTRAVENOUS | Status: DC

## 2021-07-23 MED ORDER — THIAMINE HCL 100 MG/ML IJ SOLN
100.0000 mg | Freq: Every day | INTRAMUSCULAR | Status: DC
Start: 2021-07-23 — End: 2021-07-28
  Administered 2021-07-24: 100 mg via INTRAVENOUS
  Filled 2021-07-23 (×2): qty 2

## 2021-07-23 MED ORDER — LORAZEPAM 1 MG PO TABS
1.0000 mg | ORAL_TABLET | Freq: Once | ORAL | Status: AC
  Administered 2021-07-23: 1 mg via ORAL
  Filled 2021-07-23: qty 1

## 2021-07-23 MED ORDER — LORAZEPAM 2 MG/ML IJ SOLN: 1.0000 mg | INTRAMUSCULAR | Status: DC | PRN

## 2021-07-23 MED ORDER — LORAZEPAM 1 MG PO TABS: 1.0000 mg | ORAL_TABLET | ORAL | Status: DC | PRN

## 2021-07-23 MED ORDER — BISACODYL 5 MG PO TBEC: 5.0000 mg | DELAYED_RELEASE_TABLET | Freq: Every day | ORAL | Status: DC | PRN

## 2021-07-23 MED ORDER — TRAZODONE HCL 50 MG PO TABS: 25.0000 mg | ORAL_TABLET | Freq: Every evening | ORAL | Status: DC | PRN

## 2021-07-23 MED ORDER — SENNOSIDES-DOCUSATE SODIUM 8.6-50 MG PO TABS: 1.0000 | ORAL_TABLET | Freq: Every evening | ORAL | Status: DC | PRN

## 2021-07-23 MED ORDER — SODIUM CHLORIDE 0.9% FLUSH
3.0000 mL | Freq: Two times a day (BID) | INTRAVENOUS | Status: DC
  Administered 2021-07-23 – 2021-07-27 (×9): 3 mL via INTRAVENOUS

## 2021-07-23 MED ORDER — LACTATED RINGERS IV BOLUS
500.0000 mL | Freq: Once | INTRAVENOUS | Status: AC
  Administered 2021-07-23: 500 mL via INTRAVENOUS

## 2021-07-23 MED ORDER — POTASSIUM CHLORIDE CRYS ER 20 MEQ PO TBCR
40.0000 meq | EXTENDED_RELEASE_TABLET | Freq: Once | ORAL | Status: DC
  Filled 2021-07-23: qty 2

## 2021-07-23 MED ORDER — FOLIC ACID 1 MG PO TABS
1.0000 mg | ORAL_TABLET | Freq: Every day | ORAL | Status: DC
  Administered 2021-07-23 – 2021-07-28 (×5): 1 mg via ORAL
  Filled 2021-07-23 (×5): qty 1

## 2021-07-23 MED ORDER — ONDANSETRON HCL 4 MG PO TABS: 4.0000 mg | ORAL_TABLET | Freq: Four times a day (QID) | ORAL | Status: DC | PRN

## 2021-07-23 MED ORDER — HEPARIN SODIUM (PORCINE) 5000 UNIT/ML IJ SOLN
5000.0000 [IU] | Freq: Three times a day (TID) | INTRAMUSCULAR | Status: DC
Start: 2021-07-23 — End: 2021-07-28
  Administered 2021-07-23 – 2021-07-28 (×14): 5000 [IU] via SUBCUTANEOUS
  Filled 2021-07-23 (×15): qty 1

## 2021-07-23 MED ORDER — HYDROMORPHONE HCL 1 MG/ML IJ SOLN
0.5000 mg | INTRAMUSCULAR | Status: DC | PRN
  Administered 2021-07-25: 1 mg via INTRAVENOUS
  Filled 2021-07-23: qty 1

## 2021-07-23 MED ORDER — LORAZEPAM 2 MG/ML IJ SOLN: 0.0000 mg | Freq: Two times a day (BID) | INTRAMUSCULAR | Status: AC

## 2021-07-23 MED ORDER — LORAZEPAM 2 MG/ML IJ SOLN: 0.0000 mg | Freq: Four times a day (QID) | INTRAMUSCULAR | Status: AC

## 2021-07-23 MED ORDER — SODIUM CHLORIDE 0.9 % IV SOLN: 250.0000 mL | INTRAVENOUS | Status: DC | PRN

## 2021-07-23 MED ORDER — ACETAMINOPHEN 325 MG PO TABS
650.0000 mg | ORAL_TABLET | Freq: Four times a day (QID) | ORAL | Status: DC | PRN
  Administered 2021-07-25: 650 mg via ORAL
  Filled 2021-07-23: qty 2

## 2021-07-23 MED ORDER — LEVALBUTEROL HCL 0.63 MG/3ML IN NEBU: 0.6300 mg | INHALATION_SOLUTION | Freq: Four times a day (QID) | RESPIRATORY_TRACT | Status: DC | PRN

## 2021-07-23 NOTE — Assessment & Plan Note (Addendum)
-  Postop day #1 -Status post ORIF 07/24/2021 by Dr. Aline Brochure  -Tolerated procedure well, stable  -Hip chest x-ray suggested a possible fraction, recommended follow-up CT Which revealed subcapital right femoral fracture of the hip, nondisplaced  -Orthopedic, Dr. Aline Brochure -following  -As needed analgesics

## 2021-07-23 NOTE — ED Provider Notes (Addendum)
I was notified that family was concerned about patient's right groin pain.  Patient had not mentioned that to Dr. Dayna Barker last night.  Patient points to the right inguinal region complaining of some tenderness there.  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  Will proceed with hip xray.  Hip xray suggested possible fx.  CT recommened.  CT confirms subcapital right femoral neck hip fx, non displaced.  Case discussed with Dr Aline Brochure orthopedics  D/w Dr Charlestine Massed, MD 07/23/21 1116 Notified that patient had an episode of confusion and unresponsiveness.  Patient is awake at the bedside.  He is answering questions.  Does appear to be confused about where he is.  Will proceed with head CT to rule out any acute pathology.  Question whether he is had a withdrawal seizure.  Nursing staff will notify admitting MD we will continue to monitor closely   Dorie Rank, MD 07/23/21 1212

## 2021-07-23 NOTE — ED Notes (Signed)
RN called pt daughter at time of dc due to pt confusion and necessity for transportation. Pt was complaining of rt groin pain at time of discharge. Daughter stated he is normally ambulatory, but recently has been unable to stand on his own.  RN spoke with MD. Pt is with x-ray now. Will update daughter with updates.

## 2021-07-23 NOTE — Assessment & Plan Note (Addendum)
BP still mildly elevated -Apparently he was supposed to take Norvasc 5 (may not have been taking it) -Restarting Norvasc at 10 mg p.o. daily -Added metoprolol 25 mg p.o. daily today 07/25/2021 -Monitoring closely, as needed hydralazine

## 2021-07-23 NOTE — Assessment & Plan Note (Addendum)
-  Monitoring closely, no signs of withdrawal at this point With exception of brief tachycardia overnight -CIWA protocol initiated  -Continue thiamine, folate supplements

## 2021-07-23 NOTE — Hospital Course (Signed)
Austin [name] is a 66 year old male with history of alcohol use and abuse and multiple ED visits presenting today with generalized weaknesses and right hip pain. Patient's sister brought the patient to the ED thinking he is also dehydrated slower than normal, not ambulating much, complaining of right hip pain..  She reports that he drinks about 2-3 40's daily did not had a drink since yesterday.  No recent illnesses, unable to clarify time of falls  ED: Blood pressure (!) 140/94, pulse 82, temperature 98.4 F (36.9 C), temperature source Oral, resp. rate 16, height '5\' 9"'$  (1.753 m), weight 70 kg, SpO2 97 %.  WBC 13.0, potassium 3.4, glucose 115, UA clear, EKG no acute changes, Hip chest x-ray suggested a possible fraction, recommended follow-up CT Which revealed subcapital right femoral fracture of the hip, nondisplaced

## 2021-07-23 NOTE — ED Notes (Signed)
Daughter called for update of pt. RN explained tx plan was not set yet. Will update with information as it becomes available.

## 2021-07-23 NOTE — Assessment & Plan Note (Addendum)
-  Potassium 3.4, 3.5  - Monitoring, repleted

## 2021-07-23 NOTE — H&P (Addendum)
History and Physical   Patient: Austin [name]                            PCP: Carrolyn Meiers, MD                    DOB: 1955-08-26            DOA: 07/23/2021 YEB:[id number]             DOS: 07/23/2021, 2:52 PM  [name], Austin Baxter, MD  Patient coming from:   HOME  I have personally reviewed patient's medical records, in electronic medical records, including:  [facility], and care everywhere.    Chief Complaint:   Chief Complaint  Patient presents with   Alcohol Intoxication    History of present illness:    Austin [name] is a 66 year old male with history of alcohol use and abuse and multiple ED visits presenting today with generalized weaknesses and right hip pain. Patient's sister brought the patient to the ED thinking he is also dehydrated slower than normal, not ambulating much, complaining of right hip pain..  She reports that he drinks about 2-3 40's daily did not had a drink since yesterday.  No recent illnesses, unable to clarify time of falls  ED: Blood pressure (!) 140/94, pulse 82, temperature 98.4 F (36.9 C), temperature source Oral, resp. rate 16, height $RemoveBe'5\' 9"'byKvDNMuf$  (1.753 m), weight 70 kg, SpO2 97 %.  WBC 13.0, potassium 3.4, glucose 115, UA clear, EKG no acute changes, Hip chest x-ray suggested a possible fraction, recommended follow-up CT Which revealed subcapital right femoral fracture of the hip, nondisplaced    Patient Denies having: Fever, Chills, Cough, SOB, Chest Pain, Abd pain, N/V/D, headache, dizziness, lightheadedness,  Dysuria, Joint pain, rash, open wounds     Review of Systems: As per HPI, otherwise 10 point review of systems were negative.   ----------------------------------------------------------------------------------------------------------------------  Allergies  Allergen Reactions   Fish Allergy Anaphylaxis    Home MEDs:  Prior to Admission medications   Medication Sig Start Date End Date Taking?  Authorizing Provider  amLODipine (NORVASC) 5 MG tablet Take 5 mg by mouth daily. Patient not taking: Reported on 07/23/2021    [provider]  [name] Sulfate-K Sulfate-Mg Sulf (SUPREP BOWEL PREP KIT) 17.5-3.13-1.6 GM/177ML SOLN Take 1 kit by mouth as directed. Patient not taking: Reported on 07/23/2021 09/23/17   Annitta Needs, NP    PRN MEDs: sodium chloride, acetaminophen **OR** acetaminophen, bisacodyl, hydrALAZINE, HYDROmorphone (DILAUDID) injection, ipratropium, levalbuterol, ondansetron **OR** ondansetron (ZOFRAN) IV, oxyCODONE, senna-docusate, sodium chloride flush, sodium phosphate, traZODone  Past Medical History:  Diagnosis Date   Dyspnea     Past Surgical History:  Procedure Laterality Date   COLONOSCOPY N/A 11/05/2017   Procedure: COLONOSCOPY;  Surgeon: Daneil Dolin, MD;  [facility];  Service: Endoscopy;  Laterality: N/A;  12:00   POLYPECTOMY  11/05/2017   Procedure: POLYPECTOMY;  Surgeon: Daneil Dolin, MD;  Location: [facility];  Service: Endoscopy;;  colon     reports that he has quit smoking. He has a 21.50 pack-year smoking history. He has never used smokeless tobacco. He reports current alcohol use. He reports current drug use. Drug: Marijuana.   Family History  Problem Relation Age of Onset   Hypertension Mother    Cancer Mother    Hypertension Father    Heart failure Other    Cancer Other  Physical Exam:   Vitals:   07/23/21 1114 07/23/21 1201 07/23/21 1230 07/23/21 1339  BP:  (!) 164/101 (!) 167/92 (!) 162/96  Pulse:  89 85 87  Resp:  $Remo'16 17 18  'jkzfK$ Temp:    97.9 F (36.6 C)  TempSrc:    Oral  SpO2: 98% 98% 97% 98%  Weight:      Height:       Constitutional: NAD, calm, comfortable Eyes: PERRL, lids and conjunctivae normal ENMT: Mucous membranes are moist. Posterior pharynx clear of any exudate or lesions.Normal dentition.  Neck: normal, supple, no masses, no thyromegaly Respiratory: clear to auscultation bilaterally, no  wheezing, no crackles. Normal respiratory effort. No accessory muscle use.  Cardiovascular: Regular rate and rhythm, no murmurs / rubs / gallops. No extremity edema. 2+ pedal pulses. No carotid bruits.  Abdomen: no tenderness, no masses palpated. No hepatosplenomegaly. Bowel sounds positive.  Musculoskeletal: Right hip pain, limited range of motion due to pain,  no clubbing / cyanosis. No joint deformity upper and lower extremities. no contractures. Normal muscle tone.  Neurologic: CN II-XII grossly intact. Sensation intact, DTR normal. Strength 5/5 in all 4.  Psychiatric: Normal judgment and insight. Alert and oriented x 3. Normal mood.  Skin: no rashes, lesions, ulcers. No induration Decubitus/ulcers:  Wounds: per nursing documentation  Pressure Injury 07/23/21 Perineum Right;Left Stage 2 -  Partial thickness loss of dermis presenting as a shallow open injury with a red, pink wound bed without slough. two wounds located 2cm in length and 3cm in width (Active)  07/23/21 1344  Location: Perineum  Location Orientation: Right;Left  Staging: Stage 2 -  Partial thickness loss of dermis presenting as a shallow open injury with a red, pink wound bed without slough.  Wound Description (Comments): two wounds located 2cm in length and 3cm in width  Present on Admission: Yes         Labs on admission:    I have personally reviewed following labs and imaging studies  CBC: Recent Labs  Lab 07/19/21 1636 07/23/21 0134  WBC 9.4 13.0*  NEUTROABS 6.3 9.8*  HGB 15.4 15.8  HCT 47.0 47.6  MCV 92.7 92.2  PLT 305 330   Basic Metabolic Panel: Recent Labs  Lab 07/19/21 1636 07/23/21 0134 07/23/21 1309  [name] 144 144  --   K 4.1 3.4*  --   CL 112* 109  --   CO2 26 28  --   GLUCOSE 92 115*  --   BUN 18 13  --   CREATININE 1.36* 1.18  --   CALCIUM 9.0 9.0  --   MG  --   --  2.6*  PHOS  --   --  3.5        Component Value Date/Time   COLORURINE AMBER (A) 07/23/2021 0513    APPEARANCEUR HAZY (A) 07/23/2021 0513   LABSPEC 1.019 07/23/2021 0513   PHURINE 5.0 07/23/2021 0513   GLUCOSEU NEGATIVE 07/23/2021 0513   HGBUR NEGATIVE 07/23/2021 0513   [place] NEGATIVE 07/23/2021 [place] 07/23/2021 0513   PROTEINUR 30 (A) 07/23/2021 0513   UROBILINOGEN 0.2 06/24/2010 0438   NITRITE NEGATIVE 07/23/2021 0513   LEUKOCYTESUR NEGATIVE 07/23/2021 0513    Last A1C:  No results found for: "HGBA1C"   Radiologic Exams on Admission:   CT Head Wo Contrast  Result Date: 07/23/2021 CLINICAL DATA:  Mental status change EXAM: CT HEAD WITHOUT CONTRAST TECHNIQUE: Contiguous axial images were obtained from the base of the skull  through the vertex without intravenous contrast. RADIATION DOSE REDUCTION: This exam was performed according to the departmental dose-optimization program which includes automated exposure control, adjustment of the mA and/or kV according to patient size and/or use of iterative reconstruction technique. COMPARISON:  CT head 06/24/2010 FINDINGS: Brain: No evidence of acute infarction, hemorrhage, hydrocephalus, extra-axial collection or mass lesion/mass effect. Ill-defined hypoattenuation within the cerebral white matter is nonspecific but consistent with chronic small vessel ischemic disease. Mild cerebral volume loss. Chronic infarct right thalamus. Vascular: No hyperdense vessel or unexpected calcification. Skull: Normal. Negative for fracture or focal lesion. Sinuses/Orbits: Mucous retention cyst left maxillary sinus. The paranasal sinuses and mastoid air cells are otherwise well aerated. Other: None. IMPRESSION: No acute intracranial abnormality. Electronically Signed   By: Placido Sou M.D.   On: 07/23/2021 12:52   CT HIP RIGHT WO CONTRAST  Result Date: 07/23/2021 CLINICAL DATA:  Right hip pain with irregularity on radiography, for further characterization. EXAM: CT OF THE RIGHT HIP WITHOUT CONTRAST TECHNIQUE: Multidetector CT imaging of  the right hip was performed according to the standard protocol. Multiplanar CT image reconstructions were also generated. RADIATION DOSE REDUCTION: This exam was performed according to the departmental dose-optimization program which includes automated exposure control, adjustment of the mA and/or kV according to patient size and/or use of iterative reconstruction technique. COMPARISON:  Radiographs 07/23/2021 and CT pelvis 08/03/2010 FINDINGS: Bones/Joint/Cartilage Sharp irregularity in the subcapital region of the right femoral neck along the growth plate line margin strongly favors a nondisplaced subcapital fracture over degenerative spurring. Moreover, posteriorly along the right femoral neck, there is linear lucency on image 67 series 6 suspicious for a small amount of longitudinal nondisplaced fracture in the right femoral neck. Trace right hip effusion. No other acute regional fracture identified. Bridging spurring of the right sacroiliac joint anteriorly. There is evidence of spondylosis at L5-S1 only partially included on today's exam. Ligaments Suboptimally assessed by CT. Muscles and Tendons There is some localized thickening along the right edema less muscles as on image 57 series 5, cannot exclude local hematoma. 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 Soft tissues Iliac, common femoral, and superficial femoral artery atherosclerotic calcification. IMPRESSION: 1. Essentially nondisplaced subcapital right femoral neck fracture. Subtle linear component of fracture extends longitudinally along the posterior right femoral neck on image 67 series 6. Small right hip joint effusion. Possible adjacent mild hematoma along the right gemellus musculature. 2. 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 3. Atherosclerosis. Electronically Signed   By: Van Clines M.D.   On: 07/23/2021 09:44   DG Hip Unilat W or Wo Pelvis 2-3 Views  Right  Result Date: 07/23/2021 CLINICAL DATA:  Pain, no known injury EXAM: DG HIP (WITH OR WITHOUT PELVIS) 3V RIGHT COMPARISON:  None Available. FINDINGS: Subcapital cortical irregularity of the right proximal femur. Mild degenerative changes of the bilateral hips and SI joints. Vascular calcifications. IMPRESSION: Subcapital cortical irregularity of the right proximal femur, likely due to nondisplaced fracture. Recommend further evaluation with cross-sectional imaging. Electronically Signed   By: Yetta Glassman M.D.   On: 07/23/2021 08:34    EKG:   Independently reviewed.  Orders placed or performed during the hospital encounter of 07/23/21   EKG 12-Lead   ---------------------------------------------------------------------------------------------------------------------------------------    Assessment / Plan:   Principal Problem:   Hip fx, right, closed, initial encounter [facility]) Active Problems:   Essential hypertension   Hypokalemia   EtOH dependence (HCC)   Pressure injury of skin  Assessment and Plan: * Hip fx, right, closed, initial encounter ([place]) -Hip chest x-ray suggested a possible fraction, recommended follow-up CT Which revealed subcapital right femoral fracture of the hip, nondisplaced  - ED provider Dr. Tomi Bamberger discussed with Dr. Aline Brochure -Pending evaluation -The patient n.p.o. -As needed analgesics  Essential hypertension BP as high as 174/115, currently 155/96 -Apparently he was supposed to take Norvasc 5 mg daily but he has not been taking it -Restarting Norvasc -Monitoring closely, as needed hydralazine -Likely will initiate BP meds on discharge  Hypokalemia -Potassium 3.4 - Monitoring, repleted  EtOH dependence ([place]) - We will monitor patient on telemetry bed -CIWA protocol initiated  -Continue thiamine folate supplements        Consults called: Orthopedic Dr.  Aline Brochure -------------------------------------------------------------------------------------------------------------------------------------------- DVT prophylaxis:  heparin injection 5,000 Units Start: 07/23/21 1400 TED hose Start: 07/23/21 1114 SCDs Start: 07/23/21 1114   Code Status:   Code Status: Full Code   Admission status: Patient will be admitted as Inpatient, with a greater than 2 midnight length of stay. Level of care: Telemetry   Family Communication:  none at bedside  (The above findings and plan of care has been discussed with patient in detail, the patient expressed understanding and agreement of above plan)  --------------------------------------------------------------------------------------------------------------------------------------------------  Disposition Plan: >3 days Status is: Inpatient Remains inpatient appropriate because: Needing treatment for detox, and hip fracture intervention, surgical intervention     ----------------------------------------------------------------------------------------------------------------------------------------------------  Time spent: > than  75  Min.   SIGNED: Deatra James, MD, FHM. [facility],  Pager (Please use [email or web address] to page to text)  If 7PM-7AM, please contact night-coverage [email or web address],  07/23/2021, 2:52 PM

## 2021-07-23 NOTE — ED Notes (Signed)
Pt tolerated fluid challenge.

## 2021-07-23 NOTE — ED Notes (Signed)
Patient reports drinking 2-3 40's daily. Last drink was reported at 07/21/2021

## 2021-07-23 NOTE — ED Provider Notes (Signed)
[facility] Provider Note   CSN: [id number] Arrival date & time: 07/22/21  2343     Hist Chief Complaint  Patient presents with   Alcohol Intoxication    Austin [name] is a 66 y.o. male.  Patient states he feels fine but is here because his sister thought he was dehydrated because he was slower than normal getting around. He thinks he is slow because he is almost 66 years old. No acute change. No focal weakness. Able to get up and get around. Normally drinks 2-3 40's a day, did not drink yesterday. No recent illnesses, fevers or other associated symptoms.    Alcohol Intoxication       Home Medications Prior to Admission medications   Medication Sig Start Date End Date Taking? Authorizing Provider  amLODipine (NORVASC) 5 MG tablet Take 5 mg by mouth daily.    [provider]  Na Sulfate-K Sulfate-Mg Sulf (SUPREP BOWEL PREP KIT) 17.5-3.13-1.6 GM/177ML SOLN Take 1 kit by mouth as directed. 09/23/17   Annitta Needs, NP      Allergies    Fish allergy    Review of Systems   Review of Systems  Physical Exam Updated Vital Signs BP 130/88   Pulse (!) 111   Temp 98.1 F (36.7 C)   Resp 18   Ht $R'5\' 9"'Di$  (1.753 m)   Wt 70 kg   SpO2 99%   BMI 22.79 kg/m  Physical Exam Vitals and nursing note reviewed.  Constitutional:      Appearance: He is well-developed.     Comments: Somewhat unkempt appearance  HENT:     Head: Normocephalic and atraumatic.     Mouth/Throat:     Mouth: Mucous membranes are moist.  Eyes:     Pupils: Pupils are equal, round, and reactive to light.  Cardiovascular:     Rate and Rhythm: Normal rate.  Pulmonary:     Effort: Pulmonary effort is normal. No respiratory distress.  Abdominal:     General: Abdomen is flat. There is no distension.  Musculoskeletal:        General: Normal range of motion.     Cervical back: Normal range of motion.  Skin:    General: Skin is warm and dry.  Neurological:     General: No  focal deficit present.     Mental Status: He is alert.     ED Results / Procedures / Treatments   Labs (all labs ordered are listed, but only abnormal results are displayed) Labs Reviewed  CBC WITH DIFFERENTIAL/PLATELET - Abnormal; Notable for the following components:      Result Value   WBC 13.0 (*)    RDW 15.9 (*)    Neutro Abs 9.8 (*)    Monocytes Absolute 1.2 (*)    All other components within normal limits  BASIC METABOLIC PANEL - Abnormal; Notable for the following components:   Potassium 3.4 (*)    Glucose, Bld 115 (*)    All other components within normal limits  URINALYSIS, ROUTINE W REFLEX MICROSCOPIC - Abnormal; Notable for the following components:   Color, Urine AMBER (*)    APPearance HAZY (*)    Protein, ur 30 (*)    Bacteria, UA RARE (*)    All other components within normal limits    EKG None  Radiology No results found.  Procedures Procedures    Medications Ordered in ED Medications  LORazepam (ATIVAN) tablet 1 mg (has no administration in time  range)    ED Course/ Medical Decision Making/ A&P                           Medical Decision Making Amount and/or Complexity of Data Reviewed Labs: ordered.  Risk Prescription drug management.   Patient without neurologic deficit or story c/w cva.   No evidence of dehydration requiring IVF.   No evidence of UTI. No objective fidnings of pneumonia, cellulitis, abdominal infection or other causes for weakness.   While in the ED for >7 hours did start to develop some symptoms of withdrawals. Ativan provided. HR improves. States he doesn't want to quit drinking but when he has had prolonged sober episodes in the past did not have siezures or hallucinations. Will d/c to fu w/ PCP PRN.   Final Clinical Impression(s) / ED Diagnoses Final diagnoses:  ETOH abuse    Rx / DC Orders ED Discharge Orders     None         Darrel Baroni, Corene Cornea, MD 07/23/21 [phone]

## 2021-07-23 NOTE — ED Triage Notes (Signed)
Pt has been drinking alcohol and family states he needs fluids because he is dehydrated. Per pt he has not been drinking today.

## 2021-07-24 ENCOUNTER — Inpatient Hospital Stay (HOSPITAL_COMMUNITY): Payer: Medicare Other | Admitting: Certified Registered"

## 2021-07-24 ENCOUNTER — Inpatient Hospital Stay (HOSPITAL_COMMUNITY): Payer: Medicare Other

## 2021-07-24 ENCOUNTER — Encounter (HOSPITAL_COMMUNITY)
Admission: EM | Disposition: A | Discharge status: Skilled Nursing Facility | Payer: Self-pay | Source: Home / Self Care | Attending: Family Medicine

## 2021-07-24 ENCOUNTER — Encounter (HOSPITAL_COMMUNITY): Payer: Self-pay | Admitting: Family Medicine

## 2021-07-24 ENCOUNTER — Other Ambulatory Visit: Payer: Self-pay

## 2021-07-24 DIAGNOSIS — Z87891 Personal history of nicotine dependence: Secondary | ICD-10-CM | POA: Diagnosis not present

## 2021-07-24 DIAGNOSIS — D649 Anemia, unspecified: Secondary | ICD-10-CM | POA: Diagnosis not present

## 2021-07-24 DIAGNOSIS — S72001A Fracture of unspecified part of neck of right femur, initial encounter for closed fracture: Secondary | ICD-10-CM | POA: Diagnosis not present

## 2021-07-24 DIAGNOSIS — I10 Essential (primary) hypertension: Secondary | ICD-10-CM | POA: Diagnosis not present

## 2021-07-24 DIAGNOSIS — S72044A Nondisplaced fracture of base of neck of right femur, initial encounter for closed fracture: Secondary | ICD-10-CM

## 2021-07-24 HISTORY — PX: HIP PINNING,CANNULATED: SHX1758

## 2021-07-24 LAB — BASIC METABOLIC PANEL
Anion gap: 8 (ref 5–15)
BUN: 12 mg/dL (ref 8–23)
CO2: 27 mmol/L (ref 22–32)
Calcium: 8.5 mg/dL — ABNORMAL LOW (ref 8.9–10.3)
Chloride: 109 mmol/L (ref 98–111)
Creatinine, Ser: 0.81 mg/dL (ref 0.61–1.24)
GFR, Estimated: >60 mL/min (ref >60)
Glucose, Bld: 85 mg/dL (ref 70–99)
Potassium: 3.2 mmol/L — ABNORMAL LOW (ref 3.5–5.1)
Sodium: 144 mmol/L (ref 135–145)

## 2021-07-24 LAB — APTT: aPTT: 34 seconds (ref 24–36)

## 2021-07-24 LAB — CBC
HCT: 43.2 % (ref 39.0–52.0)
Hemoglobin: 14.2 g/dL (ref 13.0–17.0)
MCH: 30.6 pg (ref 26.0–34.0)
MCHC: 32.9 g/dL (ref 30.0–36.0)
MCV: 93.1 fL (ref 80.0–100.0)
Platelets: 258 10*3/uL (ref 150–400)
RBC: 4.64 MIL/uL (ref 4.22–5.81)
RDW: 15.4 % (ref 11.5–15.5)
WBC: 10.1 10*3/uL (ref 4.0–10.5)
nRBC: 0 % (ref 0.0–0.2)

## 2021-07-24 LAB — PROTIME-INR
INR: 1.1 (ref 0.8–1.2)
Prothrombin Time: 14.2 seconds (ref 11.4–15.2)

## 2021-07-24 LAB — MRSA NEXT GEN BY PCR, NASAL: MRSA by PCR Next Gen: NOT DETECTED

## 2021-07-24 LAB — GLUCOSE, CAPILLARY: Glucose-Capillary: 96 mg/dL (ref 70–99)

## 2021-07-24 SURGERY — FIXATION, FEMUR, NECK, PERCUTANEOUS, USING SCREW
Anesthesia: General | Site: Hip | Laterality: Right

## 2021-07-24 MED ORDER — TRAMADOL HCL 50 MG PO TABS
50.0000 mg | ORAL_TABLET | Freq: Four times a day (QID) | ORAL | Status: DC
  Administered 2021-07-24 – 2021-07-28 (×14): 50 mg via ORAL
  Filled 2021-07-24 (×14): qty 1

## 2021-07-24 MED ORDER — PHENOL 1.4 % MT LIQD: 1.0000 | OROMUCOSAL | Status: DC | PRN

## 2021-07-24 MED ORDER — SODIUM CHLORIDE 0.9 % IV SOLN: INTRAVENOUS | Status: DC

## 2021-07-24 MED ORDER — DEXAMETHASONE SODIUM PHOSPHATE 10 MG/ML IJ SOLN
INTRAMUSCULAR | Status: DC | PRN
  Administered 2021-07-24: 5 mg via INTRAVENOUS

## 2021-07-24 MED ORDER — 0.9 % SODIUM CHLORIDE (POUR BTL) OPTIME
TOPICAL | Status: DC | PRN
  Administered 2021-07-24: 1000 mL

## 2021-07-24 MED ORDER — CEFAZOLIN SODIUM-DEXTROSE 2-4 GM/100ML-% IV SOLN
2.0000 g | Freq: Four times a day (QID) | INTRAVENOUS | Status: AC
  Administered 2021-07-24: 2 g via INTRAVENOUS
  Filled 2021-07-24 (×2): qty 100

## 2021-07-24 MED ORDER — ONDANSETRON HCL 4 MG/2ML IJ SOLN: 4.0000 mg | Freq: Four times a day (QID) | INTRAMUSCULAR | Status: DC | PRN

## 2021-07-24 MED ORDER — METOCLOPRAMIDE HCL 5 MG/ML IJ SOLN: 5.0000 mg | Freq: Three times a day (TID) | INTRAMUSCULAR | Status: DC | PRN

## 2021-07-24 MED ORDER — BUPIVACAINE-EPINEPHRINE (PF) 0.5% -1:200000 IJ SOLN
INTRAMUSCULAR | Status: DC | PRN
  Administered 2021-07-24: 30 mL

## 2021-07-24 MED ORDER — PROPOFOL 10 MG/ML IV BOLUS
INTRAVENOUS | Status: DC | PRN
  Administered 2021-07-24: 120 mg via INTRAVENOUS

## 2021-07-24 MED ORDER — ONDANSETRON HCL 4 MG PO TABS: 4.0000 mg | ORAL_TABLET | Freq: Four times a day (QID) | ORAL | Status: DC | PRN

## 2021-07-24 MED ORDER — DEXMEDETOMIDINE (PRECEDEX) IN NS 20 MCG/5ML (4 MCG/ML) IV SYRINGE
PREFILLED_SYRINGE | INTRAVENOUS | Status: DC | PRN
  Administered 2021-07-24: 8 ug via INTRAVENOUS

## 2021-07-24 MED ORDER — LIDOCAINE HCL (CARDIAC) PF 100 MG/5ML IV SOSY
PREFILLED_SYRINGE | INTRAVENOUS | Status: DC | PRN
  Administered 2021-07-24: 60 mg via INTRATRACHEAL

## 2021-07-24 MED ORDER — ALUM & MAG HYDROXIDE-SIMETH 200-200-20 MG/5ML PO SUSP: 30.0000 mL | ORAL | Status: DC | PRN

## 2021-07-24 MED ORDER — METOCLOPRAMIDE HCL 5 MG PO TABS: 5.0000 mg | ORAL_TABLET | Freq: Three times a day (TID) | ORAL | Status: DC | PRN

## 2021-07-24 MED ORDER — ASPIRIN EC 325 MG PO TBEC
325.0000 mg | DELAYED_RELEASE_TABLET | Freq: Every day | ORAL | Status: DC
  Administered 2021-07-25 – 2021-07-28 (×4): 325 mg via ORAL
  Filled 2021-07-24 (×4): qty 1

## 2021-07-24 MED ORDER — FENTANYL CITRATE (PF) 100 MCG/2ML IJ SOLN
INTRAMUSCULAR | Status: DC | PRN
  Administered 2021-07-24 (×5): 50 ug via INTRAVENOUS

## 2021-07-24 MED ORDER — POVIDONE-IODINE 10 % EX SWAB: 2.0000 | Freq: Once | CUTANEOUS | Status: DC

## 2021-07-24 MED ORDER — CHLORHEXIDINE GLUCONATE 0.12 % MT SOLN
15.0000 mL | Freq: Once | OROMUCOSAL | Status: AC
  Administered 2021-07-24: 15 mL via OROMUCOSAL
  Filled 2021-07-24: qty 15

## 2021-07-24 MED ORDER — LACTATED RINGERS IV SOLN
INTRAVENOUS | Status: DC
Start: 2021-07-24 — End: 2021-07-24

## 2021-07-24 MED ORDER — FENTANYL CITRATE (PF) 250 MCG/5ML IJ SOLN
INTRAMUSCULAR | Status: AC
  Filled 2021-07-24: qty 5

## 2021-07-24 MED ORDER — MORPHINE SULFATE (PF) 2 MG/ML IV SOLN: 0.5000 mg | INTRAVENOUS | Status: DC | PRN

## 2021-07-24 MED ORDER — DOCUSATE SODIUM 100 MG PO CAPS
100.0000 mg | ORAL_CAPSULE | Freq: Two times a day (BID) | ORAL | Status: DC
  Administered 2021-07-24 – 2021-07-28 (×8): 100 mg via ORAL
  Filled 2021-07-24 (×8): qty 1

## 2021-07-24 MED ORDER — LIDOCAINE HCL (PF) 2 % IJ SOLN
INTRAMUSCULAR | Status: AC
  Filled 2021-07-24: qty 5

## 2021-07-24 MED ORDER — CEFAZOLIN SODIUM-DEXTROSE 2-4 GM/100ML-% IV SOLN
INTRAVENOUS | Status: AC
  Administered 2021-07-24: 2 g via INTRAVENOUS
  Filled 2021-07-24: qty 100

## 2021-07-24 MED ORDER — CHLORHEXIDINE GLUCONATE CLOTH 2 % EX PADS
6.0000 | MEDICATED_PAD | Freq: Every day | CUTANEOUS | Status: DC
  Administered 2021-07-24 – 2021-07-25 (×2): 6 via TOPICAL

## 2021-07-24 MED ORDER — ORAL CARE MOUTH RINSE
15.0000 mL | Freq: Once | OROMUCOSAL | Status: AC
Start: 2021-07-24 — End: 2021-07-24

## 2021-07-24 MED ORDER — ONDANSETRON HCL 4 MG/2ML IJ SOLN
INTRAMUSCULAR | Status: DC | PRN
  Administered 2021-07-24: 4 mg via INTRAVENOUS

## 2021-07-24 MED ORDER — CEFAZOLIN SODIUM-DEXTROSE 2-4 GM/100ML-% IV SOLN
2.0000 g | INTRAVENOUS | Status: AC
  Administered 2021-07-24: 2 g via INTRAVENOUS

## 2021-07-24 MED ORDER — MENTHOL 3 MG MT LOZG: 1.0000 | LOZENGE | OROMUCOSAL | Status: DC | PRN

## 2021-07-24 MED ORDER — HYDROCODONE-ACETAMINOPHEN 7.5-325 MG PO TABS: 1.0000 | ORAL_TABLET | ORAL | Status: DC | PRN

## 2021-07-24 MED ORDER — ONDANSETRON HCL 4 MG/2ML IJ SOLN
INTRAMUSCULAR | Status: AC
  Filled 2021-07-24: qty 2

## 2021-07-24 MED ORDER — CHLORHEXIDINE GLUCONATE 4 % EX LIQD: 60.0000 mL | Freq: Once | CUTANEOUS | Status: DC

## 2021-07-24 MED ORDER — POTASSIUM CHLORIDE 10 MEQ/100ML IV SOLN
10.0000 meq | INTRAVENOUS | Status: AC
  Administered 2021-07-24 (×4): 10 meq via INTRAVENOUS
  Filled 2021-07-24 (×4): qty 100

## 2021-07-24 MED ORDER — METOPROLOL TARTRATE 5 MG/5ML IV SOLN
5.0000 mg | Freq: Three times a day (TID) | INTRAVENOUS | Status: AC
  Administered 2021-07-24: 5 mg via INTRAVENOUS
  Filled 2021-07-24: qty 5

## 2021-07-24 SURGICAL SUPPLY — 57 items
APL PRP STRL LF DISP 70% ISPRP (MISCELLANEOUS) ×1
APL SKNCLS STERI-STRIP NONHPOA (GAUZE/BANDAGES/DRESSINGS) ×1
BAG HAMPER (MISCELLANEOUS) ×2 IMPLANT
BENZOIN TINCTURE PRP APPL 2/3 (GAUZE/BANDAGES/DRESSINGS) ×1 IMPLANT
BIT DRILL 4.8X300 (BIT) ×2 IMPLANT
BLADE SURG SZ10 CARB STEEL (BLADE) ×4 IMPLANT
BNDG GAUZE ELAST 4 BULKY (GAUZE/BANDAGES/DRESSINGS) ×3 IMPLANT
CHLORAPREP W/TINT 26 (MISCELLANEOUS) ×2 IMPLANT
CLOTH BEACON ORANGE TIMEOUT ST (SAFETY) ×2 IMPLANT
COVER LIGHT HANDLE STERIS (MISCELLANEOUS) ×4 IMPLANT
COVER PERINEAL POST (MISCELLANEOUS) ×2 IMPLANT
DRAPE STERI IOBAN 125X83 (DRAPES) ×2 IMPLANT
DRESSING MEPILEX BORDER 6X8 (GAUZE/BANDAGES/DRESSINGS) ×1 IMPLANT
DRSG MEPILEX BORDER 6X8 (GAUZE/BANDAGES/DRESSINGS) ×2
DRSG MEPILEX SACRM 8.7X9.8 (GAUZE/BANDAGES/DRESSINGS) ×2 IMPLANT
GLOVE BIOGEL PI IND STRL 6.5 (GLOVE) IMPLANT
GLOVE BIOGEL PI IND STRL 7.0 (GLOVE) ×2 IMPLANT
GLOVE BIOGEL PI IND STRL 8.5 (GLOVE) IMPLANT
GLOVE BIOGEL PI INDICATOR 6.5 (GLOVE) ×1
GLOVE BIOGEL PI INDICATOR 7.0 (GLOVE) ×1
GLOVE BIOGEL PI INDICATOR 8.5 (GLOVE) ×1
GLOVE SKINSENSE NS SZ8.0 LF (GLOVE) ×2
GLOVE SKINSENSE STRL SZ8.0 LF (GLOVE) IMPLANT
GLOVE SURG SS PI 6.5 STRL IVOR (GLOVE) ×1 IMPLANT
GOWN STRL REUS W/TWL LRG LVL3 (GOWN DISPOSABLE) ×2 IMPLANT
GOWN STRL REUS W/TWL XL LVL3 (GOWN DISPOSABLE) ×2 IMPLANT
INST SET MAJOR BONE (KITS) ×2 IMPLANT
KIT BLADEGUARD II DBL (SET/KITS/TRAYS/PACK) ×2 IMPLANT
KIT TURNOVER CYSTO (KITS) ×2 IMPLANT
MANIFOLD NEPTUNE II (INSTRUMENTS) ×2 IMPLANT
MARKER SKIN DUAL TIP RULER LAB (MISCELLANEOUS) ×2 IMPLANT
NDL HYPO 21X1.5 SAFETY (NEEDLE) ×1 IMPLANT
NDL SPNL 18GX3.5 QUINCKE PK (NEEDLE) ×1 IMPLANT
NEEDLE HYPO 21X1.5 SAFETY (NEEDLE) ×2 IMPLANT
NEEDLE SPNL 18GX3.5 QUINCKE PK (NEEDLE) ×2 IMPLANT
NS IRRIG 1000ML POUR BTL (IV SOLUTION) ×2 IMPLANT
PACK BASIC III (CUSTOM PROCEDURE TRAY) ×2
PACK SRG BSC III STRL LF ECLPS (CUSTOM PROCEDURE TRAY) ×1 IMPLANT
PAD ABD 5X9 TENDERSORB (GAUZE/BANDAGES/DRESSINGS) ×3 IMPLANT
PENCIL SMOKE EVACUATOR COATED (MISCELLANEOUS) ×2 IMPLANT
PIN GUIDE DRILL TIP 2.8X300 (DRILL) ×7 IMPLANT
SCREW CANN FULL THD 6.5X90 (Screw) ×2 IMPLANT
SCREW FULLY THREADED 6.5X80 (Screw) ×2 IMPLANT
SET BASIN LINEN APH (SET/KITS/TRAYS/PACK) ×2 IMPLANT
SPONGE T-LAP 18X18 ~~LOC~~+RFID (SPONGE) ×4 IMPLANT
STRIP CLOSURE SKIN 1/2X4 (GAUZE/BANDAGES/DRESSINGS) ×1 IMPLANT
SUT BRALON NAB BRD #1 30IN (SUTURE) ×2 IMPLANT
SUT MNCRL 0 VIOLET CTX 36 (SUTURE) ×1 IMPLANT
SUT MON AB 2-0 CT1 36 (SUTURE) ×1 IMPLANT
SUT MONOCRYL 0 CTX 36 (SUTURE) ×4
SYR 30ML LL (SYRINGE) ×1 IMPLANT
SYR BULB IRRIG 60ML STRL (SYRINGE) ×4 IMPLANT
TRAY FOLEY W/BAG SLVR 16FR (SET/KITS/TRAYS/PACK) ×2
TRAY FOLEY W/BAG SLVR 16FR ST (SET/KITS/TRAYS/PACK) ×1 IMPLANT
TUBE SUCTION HIGH CAP CLEAR NV (SUCTIONS) ×1 IMPLANT
WASHER FLAT 6.5MM (Washer) ×4 IMPLANT
YANKAUER SUCT BULB TIP 10FT TU (MISCELLANEOUS) ×2 IMPLANT

## 2021-07-24 NOTE — Interval H&P Note (Signed)
History and Physical Interval Note:  07/24/2021 12:22 PM  Austin [name]  has presented today for surgery, with the diagnosis of right hip fracture.  The various methods of treatment have been discussed with the patient and family. After consideration of risks, benefits and other options for treatment, the patient has consented to  Procedure(s): CANNULATED HIP PINNING (Right) as a surgical intervention.  The patient's history has been reviewed, patient examined, no change in status, stable for surgery.  I have reviewed the patient's chart and labs.  Questions were answered to the patient's satisfaction.     Arther Hinojosa

## 2021-07-24 NOTE — Progress Notes (Addendum)
Patient given IV metoprolol, HR improved to 79. Patient condition unchanged.   Dr. Roger Shelter notified by B. Robertson via page. [place], charge also aware.

## 2021-07-24 NOTE — Op Note (Signed)
07/24/2021  2:18 PM  PATIENT:  Austin [name]  66 y.o. male  PRE-OPERATIVE DIAGNOSIS:  right hip fracture  POST-OPERATIVE DIAGNOSIS:  right hip fracture  PROCEDURE:  Procedure(s): CANNULATED HIP PINNING (Right)  Implants Biomet cannulated screws x4 with 4 washers the 2 superior screws are 80 mm the neck screw is 90 mm in the neck screw was 90 mm  Surgical findings nondisplaced fracture right femoral neck, subcapital  Details of procedure the patient was seen in the preop area evaluated cleared for surgery.  The surgical site was confirmed using x-rays as the patient is confused  Patient was taken the operating room for general anesthesia and a Foley catheter insertion.  He was placed on the fracture table his right leg was placed in traction his left leg was extended and abducted  The C-arm was brought in the fracture was stable and reduced in its current position.  The leg was internally rotated to assist with implant position  After sterile prep and drape and timeout.  A lateral incision was made just inferior to the greater trochanter the subcutaneous tissue was divided in line with the skin incision.  The fascia was then divided.  The vastus lateralis fascia was split in blunt finger dissection was carried out down to the lateral femur at which time the periosteal elevator was used to expose the bone  I retractor was placed  4 pins were placed using C arm guidance.  We initially started with 3 pins and then an additional inferior pin was placed to prevent varus collapse  Each pin was placed x-rayed.  The lateral cortex was drilled.  Screw and washer was placed by power and then by hand  Final imaging confirmed hardware placement fracture remained reduced  The wound was irrigated and closed in layered fashion with 0 Monocryl, #1 Braylon, 0 Monocryl 2-0 Monocryl subcuticular with benzoin and Steri-Strips followed by sterile dressing  The patient was then placed on a regular  bed  Postop plan weightbearing as tolerated  X-rays can be done at week #4 and 12  First postop visit at postop week #4 DVT prophylaxis for 30 days after discharge    SURGEON:  Surgeon(s) and Role:    * Carole Civil, MD - Primary  PHYSICIAN ASSISTANT:   ASSISTANTS: none   ANESTHESIA:   general  EBL:  100 mL   BLOOD ADMINISTERED:none  DRAINS: none   LOCAL MEDICATIONS USED:  MARCAINE     SPECIMEN:  No Specimen  DISPOSITION OF SPECIMEN:  N/A  COUNTS:  YES  TOURNIQUET:  * No tourniquets in log *  DICTATION: .Dragon Dictation  PLAN OF CARE: Admit to inpatient   PATIENT DISPOSITION:  PACU - hemodynamically stable.   Delay start of Pharmacological VTE agent (>24hrs) due to surgical blood loss or risk of bleeding: not applicable

## 2021-07-24 NOTE — Progress Notes (Signed)
Patient HR 66, now a green mews.

## 2021-07-24 NOTE — H&P (View-Only) (Signed)
Reason for Consult: Right hip fracture Referring Physician: seyed shahmehdi md  Austin [name] is an 67 y.o. male.  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  Patient does not remember falling.  His daughter says that sometime between Sunday and Monday he must of fallen because his status change  He is in the hospital now with a right groin pain nonradiating seems to affect his weightbearing in his symptoms worsened with weightbearing or attempts at weightbearing, movement  Past Medical History:  Diagnosis Date   Dyspnea     Past Surgical History:  Procedure Laterality Date   COLONOSCOPY N/A 11/05/2017   Procedure: COLONOSCOPY;  Surgeon: Daneil Dolin, MD;  [facility]: [facility];  Service: Endoscopy;  Laterality: N/A;  12:00   POLYPECTOMY  11/05/2017   Procedure: POLYPECTOMY;  Surgeon: Daneil Dolin, MD;  Location: [facility];  Service: Endoscopy;;  colon    Family History  Problem Relation Age of Onset   Hypertension Mother    Cancer Mother    Hypertension Father    Heart failure Other    Cancer Other     Social History:  reports that he has quit smoking. He has a 21.50 pack-year smoking history. He has never used smokeless tobacco. He reports current alcohol use. He reports current drug use. Drug: Marijuana.  Allergies:  Allergies  Allergen Reactions   Fish Allergy Anaphylaxis    Medications:  Current Outpatient Medications  Medication Instructions   amLODipine (NORVASC) 5 mg, Daily   Na Sulfate-K Sulfate-Mg Sulf (SUPREP BOWEL PREP KIT) 17.5-3.13-1.6 GM/177ML SOLN 1 kit, Oral, As directed     Results for orders placed or performed during the hospital encounter of 07/23/21 (from the past 48 hour(s))  CBC with Differential     Status: Abnormal   Collection Time: 07/23/21  1:34 AM  Result Value Ref Range   WBC 13.0  (H) 4.0 - 10.5 K/uL   RBC 5.16 4.22 - 5.81 MIL/uL   Hemoglobin 15.8 13.0 - 17.0 g/dL   HCT 47.6 39.0 - 52.0 %   MCV 92.2 80.0 - 100.0 fL   MCH 30.6 26.0 - 34.0 pg   MCHC 33.2 30.0 - 36.0 g/dL   RDW 15.9 (H) 11.5 - 15.5 %   Platelets 281 150 - 400 K/uL   nRBC 0.0 0.0 - 0.2 %   Neutrophils Relative % 75 %   Neutro Abs 9.8 (H) 1.7 - 7.7 K/uL   Lymphocytes Relative 15 %   Lymphs Abs 2.0 0.7 - 4.0 K/uL   Monocytes Relative 9 %   Monocytes Absolute 1.2 (H) 0.1 - 1.0 K/uL   Eosinophils Relative 0 %   Eosinophils Absolute 0.0 0.0 - 0.5 K/uL   Basophils Relative 0 %   Basophils Absolute 0.0 0.0 - 0.1 K/uL   Immature Granulocytes 1 %   Abs Immature Granulocytes 0.06 0.00 - 0.07 K/uL    Comment: Performed at [facility], [address], [place], [place] [address]  Basic metabolic panel     Status: Abnormal   Collection Time: 07/23/21  1:34 AM  Result Value Ref Range   Sodium 144 135 - 145 mmol/L   Potassium 3.4 (L) 3.5 - 5.1 mmol/L   Chloride 109 98 - 111 mmol/L   CO2 28 22 - 32 mmol/L   Glucose, Bld 115 (H) 70 - 99 mg/dL  Comment: Glucose reference range applies only to samples taken after fasting for at least 8 hours.   BUN 13 8 - 23 mg/dL   Creatinine, Ser 1.18 0.61 - 1.24 mg/dL   Calcium 9.0 8.9 - 10.3 mg/dL   GFR, Estimated >60 >60 mL/min    Comment: (NOTE) Calculated using the CKD-EPI Creatinine Equation (2021)    Anion gap 7 5 - 15    Comment: Performed at [facility], [address], [place], [place] [address]  Urinalysis, Routine w reflex microscopic Urine, Clean Catch     Status: Abnormal   Collection Time: 07/23/21  5:13 AM  Result Value Ref Range   Color, Urine AMBER (A) YELLOW    Comment: BIOCHEMICALS MAY BE AFFECTED BY COLOR   APPearance HAZY (A) CLEAR   Specific Gravity, Urine 1.019 1.005 - 1.030   pH 5.0 5.0 - 8.0   Glucose, UA NEGATIVE NEGATIVE mg/dL   Hgb urine dipstick NEGATIVE NEGATIVE   Bilirubin Urine NEGATIVE NEGATIVE   Ketones, ur NEGATIVE NEGATIVE  mg/dL   Protein, ur 30 (A) NEGATIVE mg/dL   Nitrite NEGATIVE NEGATIVE   Leukocytes,Ua NEGATIVE NEGATIVE   RBC / HPF 0-5 0 - 5 RBC/hpf   WBC, UA 6-10 0 - 5 WBC/hpf   Bacteria, UA RARE (A) NONE SEEN   Squamous Epithelial / LPF 0-5 0 - 5   Mucus PRESENT    Hyaline Casts, UA PRESENT     Comment: Performed at [facility], [address], [place], [place] [address]  HIV Antibody (routine testing w rflx)     Status: None   Collection Time: 07/23/21  1:09 PM  Result Value Ref Range   HIV Screen 4th Generation wRfx Non Reactive Non Reactive    Comment: Performed at [facility], [place] [address], [place], [place] [address]  Magnesium     Status: Abnormal   Collection Time: 07/23/21  1:09 PM  Result Value Ref Range   Magnesium 2.6 (H) 1.7 - 2.4 mg/dL    Comment: Performed at [facility], [address], [place], [place] [address]  Phosphorus     Status: None   Collection Time: 07/23/21  1:09 PM  Result Value Ref Range   Phosphorus 3.5 2.5 - 4.6 mg/dL    Comment: Performed at [facility], [address], [place], [place] [address]  Basic metabolic panel     Status: Abnormal   Collection Time: 07/24/21  5:04 AM  Result Value Ref Range   Sodium 144 135 - 145 mmol/L   Potassium 3.2 (L) 3.5 - 5.1 mmol/L   Chloride 109 98 - 111 mmol/L   CO2 27 22 - 32 mmol/L   Glucose, Bld 85 70 - 99 mg/dL    Comment: Glucose reference range applies only to samples taken after fasting for at least 8 hours.   BUN 12 8 - 23 mg/dL   Creatinine, Ser 0.81 0.61 - 1.24 mg/dL   Calcium 8.5 (L) 8.9 - 10.3 mg/dL   GFR, Estimated >60 >60 mL/min    Comment: (NOTE) Calculated using the CKD-EPI Creatinine Equation (2021)    Anion gap 8 5 - 15    Comment: Performed at [facility], [address] [place], [place] [address]  CBC     Status: None   Collection Time: 07/24/21  5:04 AM  Result Value Ref Range   WBC 10.1 4.0 - 10.5 K/uL   RBC 4.64 4.22 - 5.81 MIL/uL   Hemoglobin 14.2 13.0 -  17.0 g/dL   HCT 43.2 39.0  - 52.0 %   MCV 93.1 80.0 - 100.0 fL   MCH 30.6 26.0 - 34.0 pg   MCHC 32.9 30.0 - 36.0 g/dL   RDW 15.4 11.5 - 15.5 %   Platelets 258 150 - 400 K/uL   nRBC 0.0 0.0 - 0.2 %    Comment: Performed at [facility], [address] [place], [place] [address]  Protime-INR     Status: None   Collection Time: 07/24/21  5:04 AM  Result Value Ref Range   Prothrombin Time 14.2 11.4 - 15.2 seconds   INR 1.1 0.8 - 1.2    Comment: (NOTE) INR goal varies based on device and disease states. Performed at [facility], [address] [place], [place] [address]   APTT     Status: None   Collection Time: 07/24/21  5:04 AM  Result Value Ref Range   aPTT 34 24 - 36 seconds    Comment: Performed at [facility], [address], [place], [place] [address]  Glucose, capillary     Status: None   Collection Time: 07/24/21  7:17 AM  Result Value Ref Range   Glucose-Capillary 96 70 - 99 mg/dL    Comment: Glucose reference range applies only to samples taken after fasting for at least 8 hours.    CT Head Wo Contrast  Result Date: 07/23/2021 CLINICAL DATA:  Mental status change EXAM: CT HEAD WITHOUT CONTRAST TECHNIQUE: Contiguous axial images were obtained from the base of the skull through the vertex without intravenous contrast. RADIATION DOSE REDUCTION: This exam was performed according to the departmental dose-optimization program which includes automated exposure control, adjustment of the mA and/or kV according to patient size and/or use of iterative reconstruction technique. COMPARISON:  CT head 06/24/2010 FINDINGS: Brain: No evidence of acute infarction, hemorrhage, hydrocephalus, extra-axial collection or mass lesion/mass effect. Ill-defined hypoattenuation within the cerebral white matter is nonspecific but consistent with chronic small vessel ischemic disease. Mild cerebral volume loss. Chronic infarct right thalamus. Vascular: No hyperdense vessel or unexpected calcification. Skull: Normal. Negative for  fracture or focal lesion. Sinuses/Orbits: Mucous retention cyst left maxillary sinus. The paranasal sinuses and mastoid air cells are otherwise well aerated. Other: None. IMPRESSION: No acute intracranial abnormality. Electronically Signed   By: Placido Sou M.D.   On: 07/23/2021 12:52   CT HIP RIGHT WO CONTRAST  My interpretation of the CT scan is that the patient has a subcapital femoral neck fracture nondisplaced  Result Date: 07/23/2021 CLINICAL DATA:  Right hip pain with irregularity on radiography, for further characterization. EXAM: CT OF THE RIGHT HIP WITHOUT CONTRAST TECHNIQUE: Multidetector CT imaging of the right hip was performed according to the standard protocol. Multiplanar CT image reconstructions were also generated. RADIATION DOSE REDUCTION: This exam was performed according to the departmental dose-optimization program which includes automated exposure control, adjustment of the mA and/or kV according to patient size and/or use of iterative reconstruction technique. COMPARISON:  Radiographs 07/23/2021 and CT pelvis 08/03/2010 FINDINGS: Bones/Joint/Cartilage Sharp irregularity in the subcapital region of the right femoral neck along the growth plate line margin strongly favors a nondisplaced subcapital fracture over degenerative spurring. Moreover, posteriorly along the right femoral neck, there is linear lucency on image 67 series 6 suspicious for a small amount of longitudinal nondisplaced fracture in the right femoral neck. Trace right hip effusion. No other acute regional fracture identified. Bridging spurring of the right sacroiliac joint anteriorly. There is evidence of spondylosis at  L5-S1 only partially included on today's exam. Ligaments Suboptimally assessed by CT. Muscles and Tendons There is some localized thickening along the right edema less muscles as on image 57 series 5, cannot exclude local hematoma. 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 Soft tissues Iliac, common femoral, and superficial femoral artery atherosclerotic calcification. IMPRESSION: 1. Essentially nondisplaced subcapital right femoral neck fracture. Subtle linear component of fracture extends longitudinally along the posterior right femoral neck on image 67 series 6. Small right hip joint effusion. Possible adjacent mild hematoma along the right gemellus musculature. 2. 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 3. Atherosclerosis. Electronically Signed   By: Van Clines M.D.   On: 07/23/2021 09:44   DG Hip Unilat W or Wo Pelvis 2-3 Views Right  My interpretation of the plain film is that the patient has a subcapital femoral neck fracture nondisplaced  Result Date: 07/23/2021 CLINICAL DATA:  Pain, no known injury EXAM: DG HIP (WITH OR WITHOUT PELVIS) 3V RIGHT COMPARISON:  None Available. FINDINGS: Subcapital cortical irregularity of the right proximal femur. Mild degenerative changes of the bilateral hips and SI joints. Vascular calcifications. IMPRESSION: Subcapital cortical irregularity of the right proximal femur, likely due to nondisplaced fracture. Recommend further evaluation with cross-sectional imaging. Electronically Signed   By: Yetta Glassman M.D.   On: 07/23/2021 08:34    Review of Systems could not assess due to patient's mental status Blood pressure (!) 151/86, pulse (!) 52, temperature 97.6 F (36.4 C), resp. rate 18, height _0  (1.753 m), weight 65.3 kg, SpO2 99 %. Physical Exam  Constitutional: Appearance ectomorphic no gross deformity  Cardiovascular no swelling no varicosities palpation of pulses temperature edema tenderness normal  Lymph nodes groin negative  Skin normal x4 extremities  Neuro coordination, deep tendon reflexes, sensation  Psych alert and oriented to person place but not time no evidence of depression anxiety agitation  Musculoskeletal gait unable to walk  Left lower  extremity and right and left upper extremity inspection and palpation reveals no abnormalities.  Functional range of motion.  No evidence of joint subluxation, muscle tone normal no tremor   Right lower extremity  Leg lengths look equal and there is no gross external rotation deformity is tender in the proximal hip has painful range of motion no instability and normal muscle tone     Assessment/Plan: Hypokalemia potassium 3.2 Right hip fracture  The procedure has been fully reviewed with the patient; The risks and benefits of surgery have been discussed and explained and understood. Alternative treatment has also been reviewed, questions were encouraged and answered. The postoperative plan is also been reviewed.  I called his daughter she was able to speak to me regarding the risk of surgery versus nonoperative treatment which is progression of the fracture which would require hip replacement with increased risk of complications and more surgery  His daughter opted for cannulated screw fixation right hip/open treatment internal fixation right hip  I will try to correct his potassium before surgery.     Arther Rathel 07/24/2021, 8:15 AM

## 2021-07-24 NOTE — Anesthesia Postprocedure Evaluation (Signed)
Anesthesia Post Note  Patient: Austin [name]  Procedure(s) Performed: CANNULATED HIP PINNING (Right: Hip)  Patient location during evaluation: Phase II Anesthesia Type: General Level of consciousness: awake and alert Pain management: pain level controlled Vital Signs Assessment: post-procedure vital signs reviewed and stable Respiratory status: respiratory function stable Cardiovascular status: blood pressure returned to baseline and stable Postop Assessment: no apparent nausea or vomiting Anesthetic complications: no   No notable events documented.   Last Vitals:  Vitals:   07/24/21 1445 07/24/21 1500  BP:  (!) 164/100  Pulse: 96 80  Resp:  12  Temp:    SpO2: 96% 96%    Last Pain:  Vitals:   07/24/21 1445  TempSrc:   PainSc: 0-No pain                 Kaesha Kirsch C Ieesha Abbasi

## 2021-07-24 NOTE — Progress Notes (Signed)
Telephone consent obtained and verified by two nurses from daughter Tyeler Goedken.

## 2021-07-24 NOTE — Care Management Important Message (Signed)
Important Message  Patient Details  Name: Austin [name] MRN: [id number] Date of Birth: 18-Nov-1955   Medicare Important Message Given:  N/A - LOS <3 / Initial given by admissions     Dannette Barbara 07/24/2021, 5:19 PM

## 2021-07-24 NOTE — Progress Notes (Signed)
PROGRESS NOTE    Patient: Austin [name]                            PCP: Carrolyn Meiers, MD                    DOB: 23-Jun-1955            DOA: 07/23/2021 YKD:[id number]             DOS: 07/24/2021, 12:37 PM   LOS: 1 day   Date of Service: The patient was seen and examined on 07/24/2021  Subjective:   The patient was seen and examined this morning, much more awake alert oriented, complaining of the right hip pain, apparently unaware of how he broke his hip and how he fell To his chronic issue of alcohol use/abuse Denies any chest pain, fever, chills or tremors  Brief Narrative:   Austin [name] is a 66 year old male with history of alcohol use and abuse and multiple ED visits presenting today with generalized weaknesses and right hip pain. Patient's sister brought the patient to the ED thinking he is also dehydrated slower than normal, not ambulating much, complaining of right hip pain..  She reports that he drinks about 2-3 40's daily did not had a drink since yesterday.  No recent illnesses, unable to clarify time of falls  ED: Blood pressure (!) 140/94, pulse 82, temperature 98.4 F (36.9 C), temperature source Oral, resp. rate 16, height '5\' 9"'$  (1.753 m), weight 70 kg, SpO2 97 %.  WBC 13.0, potassium 3.4, glucose 115, UA clear, EKG no acute changes, Hip chest x-ray suggested a possible fraction, recommended follow-up CT Which revealed subcapital right femoral fracture of the hip, nondisplaced    Assessment & Plan:   Principal Problem:   Hip fx, right, closed, initial encounter [facility]) Active Problems:   Essential hypertension   Hypokalemia   EtOH dependence (HCC)   Pressure injury of skin     Assessment and Plan: * Hip fx, right, closed, initial encounter ([place]) -Hip chest x-ray suggested a possible fraction, recommended follow-up CT Which revealed subcapital right femoral fracture of the hip, nondisplaced  -Orthopedic, Dr. Aline Brochure -Planning for ORIF  today 07/24/2021   -The patient n.p.o. -As needed analgesics  Essential hypertension BP has stabilized -Apparently he was supposed to take Norvasc 5 mg daily but he has not been taking it -Restarting Norvasc at 10 mg p.o. daily -Monitoring closely, as needed hydralazine   Hypokalemia -Potassium 3.4 - Monitoring, repleted  EtOH dependence ([place]) -Monitoring closely, no signs of withdrawal at this point -CIWA protocol initiated  -Continue thiamine, folate supplements            ----------------------------------------------------------------------------------------------------------------------------------------------- Nutritional status:  The patient's BMI is: Body mass index is 21.26 kg/m. I agree with the assessment and plan as outlined   Skin Assessment: I have examined the patient's skin and I agree with the wound assessment as performed by wound care team As outlined belowe: Pressure Injury 07/23/21 Perineum Right;Left Stage 2 -  Partial thickness loss of dermis presenting as a shallow open injury with a red, pink wound bed without slough. two wounds located 2cm in length and 3cm in width (Active)  07/23/21 1344  Location: Perineum  Location Orientation: Right;Left  Staging: Stage 2 -  Partial thickness loss of dermis presenting as a shallow open injury with a red, pink wound bed without slough.  Wound Description (Comments):  two wounds located 2cm in length and 3cm in width  Present on Admission: Yes  Dressing Type None 07/23/21 2300    -------------------------------------------------------------------------------------------------------------------------------------------  DVT prophylaxis:  heparin injection 5,000 Units Start: 07/23/21 1400 TED hose Start: 07/23/21 1114 SCDs Start: 07/23/21 1114   Code Status:   Code Status: Full Code  Family Communication: Daughter at bedside bedside updated The above findings and plan of care has been discussed  with patient (and family)  in detail,  they expressed understanding and agreement of above. -Advance care planning has been discussed.   Admission status:   Status is: Inpatient Remains inpatient appropriate because: Needing treatment for detox, Ortho surgical intervention for right hip fracture     Procedures:   No admission procedures for hospital encounter.   Antimicrobials:  Anti-infectives (From admission, onward)    Start     Dose/Rate Route Frequency Ordered Stop   07/24/21 1200  ceFAZolin (ANCEF) IVPB 2g/100 mL premix        2 g 200 mL/hr over 30 Minutes Intravenous On call to O.R. 07/24/21 1153 07/25/21 0559   07/24/21 1157  ceFAZolin (ANCEF) 2-4 GM/100ML-% IVPB       Note to Pharmacy: Abbie Sons S: cabinet override      07/24/21 1157 07/24/21 2359        Medication:   [MAR Hold] amLODipine  10 mg Oral Daily   chlorhexidine  60 mL Topical Once   [MAR Hold] folic acid  1 mg Oral Daily   [MAR Hold] heparin  5,000 Units Subcutaneous Q8H   [MAR Hold] LORazepam  0-4 mg Intravenous Q6H   Followed by   [DEY Hold] LORazepam  0-4 mg Intravenous Q12H   [MAR Hold] multivitamin with minerals  1 tablet Oral Daily   [MAR Hold] potassium chloride  40 mEq Oral Once   povidone-iodine  2 Application Topical Once   [MAR Hold] sodium chloride flush  3 mL Intravenous Q12H   [MAR Hold] thiamine  100 mg Oral Daily   Or   [MAR Hold] thiamine  100 mg Intravenous Daily    [MAR Hold] acetaminophen **OR** [MAR Hold] acetaminophen, [MAR Hold] bisacodyl, ceFAZolin, [MAR Hold] hydrALAZINE, [MAR Hold]  HYDROmorphone (DILAUDID) injection, [MAR Hold] ipratropium, [MAR Hold] levalbuterol, [MAR Hold] ondansetron **OR** [MAR Hold] ondansetron (ZOFRAN) IV, [MAR Hold] oxyCODONE, [MAR Hold] senna-docusate, [MAR Hold] sodium phosphate, [MAR Hold] traZODone   Objective:   Vitals:   07/24/21 0209 07/24/21 0550 07/24/21 0700 07/24/21 1203  BP: 135/88 (!) 151/86  (!) 149/89  Pulse: 64 (!) 52   67  Resp: '18 18  12  '$ Temp: 97.7 F (36.5 C) 97.6 F (36.4 C)  97.9 F (36.6 C)  TempSrc:    Oral  SpO2: 100% 99%  97%  Weight:   65.3 kg   Height:        Intake/Output Summary (Last 24 hours) at 07/24/2021 1237 Last data filed at 07/24/2021 0700 Gross per 24 hour  Intake 545.93 ml  Output 650 ml  Net -104.07 ml   Filed Weights   07/23/21 0050 07/24/21 0700  Weight: 70 kg 65.3 kg     Examination:   Physical Exam  Constitution:  Alert, cooperative, no distress,  Appears calm and comfortable  Psychiatric:   Normal and stable mood and affect, cognition intact,   HEENT:        Normocephalic, PERRL, otherwise with in Normal limits  Chest:         Chest symmetric Cardio vascular:  S1/S2, RRR, No murmure, No Rubs or Gallops  pulmonary: Clear to auscultation bilaterally, respirations unlabored, negative wheezes / crackles Abdomen: Soft, non-tender, non-distended, bowel sounds,no masses, no organomegaly Muscular skeletal: Right hip pain, range of motion limited due to pain  Limited exam - in bed, able to move all 4 extremities,   Neuro: CNII-XII intact. , normal motor and sensation, reflexes intact  Extremities: No pitting edema lower extremities, +2 pulses  Skin: Dry, warm to touch, negative for any Rashes, No open wounds Wounds: per nursing documentation   ------------------------------------------------------------------------------------------------------------------------------------------    LABs:     Latest Ref Rng & Units 07/24/2021    5:04 AM 07/23/2021    1:34 AM 07/19/2021    4:36 PM  CBC  WBC 4.0 - 10.5 K/uL 10.1  13.0  9.4   Hemoglobin 13.0 - 17.0 g/dL 14.2  15.8  15.4   Hematocrit 39.0 - 52.0 % 43.2  47.6  47.0   Platelets 150 - 400 K/uL 258  281  305       Latest Ref Rng & Units 07/24/2021    5:04 AM 07/23/2021    1:34 AM 07/19/2021    4:36 PM  CMP  Glucose 70 - 99 mg/dL 85  115  92   BUN 8 - 23 mg/dL '12  13  18   '$ Creatinine 0.61 - 1.24 mg/dL 0.81  1.18   1.36   Sodium 135 - 145 mmol/L 144  144  144   Potassium 3.5 - 5.1 mmol/L 3.2  3.4  4.1   Chloride 98 - 111 mmol/L 109  109  112   CO2 22 - 32 mmol/L '27  28  26   '$ Calcium 8.9 - 10.3 mg/dL 8.5  9.0  9.0        Micro Results Recent Results (from the past 240 hour(s))  MRSA Next Gen by PCR, Nasal     Status: None   Collection Time: 07/24/21  9:33 AM   Specimen: Nasal Mucosa; Nasal Swab  Result Value Ref Range Status   MRSA by PCR Next Gen NOT DETECTED NOT DETECTED Final    Comment: (NOTE) The GeneXpert MRSA Assay (FDA approved for NASAL specimens only), is one component of a comprehensive MRSA colonization surveillance program. It is not intended to diagnose MRSA infection nor to guide or monitor treatment for MRSA infections. Test performance is not FDA approved in patients less than 82 years old. Performed at [facility], [address], [place], [place] [address]     Radiology Reports CT Head Wo Contrast  Result Date: 07/23/2021 CLINICAL DATA:  Mental status change EXAM: CT HEAD WITHOUT CONTRAST TECHNIQUE: Contiguous axial images were obtained from the base of the skull through the vertex without intravenous contrast. RADIATION DOSE REDUCTION: This exam was performed according to the departmental dose-optimization program which includes automated exposure control, adjustment of the mA and/or kV according to patient size and/or use of iterative reconstruction technique. COMPARISON:  CT head 06/24/2010 FINDINGS: Brain: No evidence of acute infarction, hemorrhage, hydrocephalus, extra-axial collection or mass lesion/mass effect. Ill-defined hypoattenuation within the cerebral white matter is nonspecific but consistent with chronic small vessel ischemic disease. Mild cerebral volume loss. Chronic infarct right thalamus. Vascular: No hyperdense vessel or unexpected calcification. Skull: Normal. Negative for fracture or focal lesion. Sinuses/Orbits: Mucous retention cyst left maxillary  sinus. The paranasal sinuses and mastoid air cells are otherwise well aerated. Other: None. IMPRESSION: No acute intracranial abnormality. Electronically Signed   By: Placido Sou  M.D.   On: 07/23/2021 12:52    SIGNED: Deatra James, MD, FHM. [facility],  Pager (please use [email or web address] to page/text) Please use Epic Secure Chat for non-urgent communication (7AM-7PM)  If 7PM-7AM, please contact night-coverage [email or web address], 07/24/2021, 12:37 PM

## 2021-07-24 NOTE — Consult Note (Addendum)
Reason for Consult: Right hip fracture Referring Physician: seyed shahmehdi md  Austin [name] is an 66 y.o. male.  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  Patient does not remember falling.  His daughter says that sometime between Sunday and Monday he must of fallen because his status change  He is in the hospital now with a right groin pain nonradiating seems to affect his weightbearing in his symptoms worsened with weightbearing or attempts at weightbearing, movement  Past Medical History:  Diagnosis Date   Dyspnea     Past Surgical History:  Procedure Laterality Date   COLONOSCOPY N/A 11/05/2017   Procedure: COLONOSCOPY;  Surgeon: Daneil Dolin, MD;  [facility];  Service: Endoscopy;  Laterality: N/A;  12:00   POLYPECTOMY  11/05/2017   Procedure: POLYPECTOMY;  Surgeon: Daneil Dolin, MD;  Location: [facility];  Service: Endoscopy;;  colon    Family History  Problem Relation Age of Onset   Hypertension Mother    Cancer Mother    Hypertension Father    Heart failure Other    Cancer Other     Social History:  reports that he has quit smoking. He has a 21.50 pack-year smoking history. He has never used smokeless tobacco. He reports current alcohol use. He reports current drug use. Drug: Marijuana.  Allergies:  Allergies  Allergen Reactions   Fish Allergy Anaphylaxis    Medications:  Current Outpatient Medications  Medication Instructions   amLODipine (NORVASC) 5 mg, Daily   Na Sulfate-K Sulfate-Mg Sulf (SUPREP BOWEL PREP KIT) 17.5-3.13-1.6 GM/177ML SOLN 1 kit, Oral, As directed     Results for orders placed or performed during the hospital encounter of 07/23/21 (from the past 48 hour(s))  CBC with Differential     Status: Abnormal   Collection Time: 07/23/21  1:34 AM  Result Value Ref Range   WBC 13.0  (H) 4.0 - 10.5 K/uL   RBC 5.16 4.22 - 5.81 MIL/uL   Hemoglobin 15.8 13.0 - 17.0 g/dL   HCT 47.6 39.0 - 52.0 %   MCV 92.2 80.0 - 100.0 fL   MCH 30.6 26.0 - 34.0 pg   MCHC 33.2 30.0 - 36.0 g/dL   RDW 15.9 (H) 11.5 - 15.5 %   Platelets 281 150 - 400 K/uL   nRBC 0.0 0.0 - 0.2 %   Neutrophils Relative % 75 %   Neutro Abs 9.8 (H) 1.7 - 7.7 K/uL   Lymphocytes Relative 15 %   Lymphs Abs 2.0 0.7 - 4.0 K/uL   Monocytes Relative 9 %   Monocytes Absolute 1.2 (H) 0.1 - 1.0 K/uL   Eosinophils Relative 0 %   Eosinophils Absolute 0.0 0.0 - 0.5 K/uL   Basophils Relative 0 %   Basophils Absolute 0.0 0.0 - 0.1 K/uL   Immature Granulocytes 1 %   Abs Immature Granulocytes 0.06 0.00 - 0.07 K/uL    Comment: Performed at [facility], [address], [place], [place] [address]  Basic metabolic panel     Status: Abnormal   Collection Time: 07/23/21  1:34 AM  Result Value Ref Range   Sodium 144 135 - 145 mmol/L   Potassium 3.4 (L) 3.5 - 5.1 mmol/L   Chloride 109 98 - 111 mmol/L   CO2 28 22 - 32 mmol/L   Glucose, Bld 115 (H) 70 - 99 mg/dL  Comment: Glucose reference range applies only to samples taken after fasting for at least 8 hours.   BUN 13 8 - 23 mg/dL   Creatinine, Ser 1.18 0.61 - 1.24 mg/dL   Calcium 9.0 8.9 - 10.3 mg/dL   GFR, Estimated >60 >60 mL/min    Comment: (NOTE) Calculated using the CKD-EPI Creatinine Equation (2021)    Anion gap 7 5 - 15    Comment: Performed at [facility], [address], [place], [place] [address]  Urinalysis, Routine w reflex microscopic Urine, Clean Catch     Status: Abnormal   Collection Time: 07/23/21  5:13 AM  Result Value Ref Range   Color, Urine AMBER (A) YELLOW    Comment: BIOCHEMICALS MAY BE AFFECTED BY COLOR   APPearance HAZY (A) CLEAR   Specific Gravity, Urine 1.019 1.005 - 1.030   pH 5.0 5.0 - 8.0   Glucose, UA NEGATIVE NEGATIVE mg/dL   Hgb urine dipstick NEGATIVE NEGATIVE   Bilirubin Urine NEGATIVE NEGATIVE   Ketones, ur NEGATIVE NEGATIVE  mg/dL   Protein, ur 30 (A) NEGATIVE mg/dL   Nitrite NEGATIVE NEGATIVE   Leukocytes,Ua NEGATIVE NEGATIVE   RBC / HPF 0-5 0 - 5 RBC/hpf   WBC, UA 6-10 0 - 5 WBC/hpf   Bacteria, UA RARE (A) NONE SEEN   Squamous Epithelial / LPF 0-5 0 - 5   Mucus PRESENT    Hyaline Casts, UA PRESENT     Comment: Performed at [facility], [address], [place], [place] [address]  HIV Antibody (routine testing w rflx)     Status: None   Collection Time: 07/23/21  1:09 PM  Result Value Ref Range   HIV Screen 4th Generation wRfx Non Reactive Non Reactive    Comment: Performed at [facility], [place] [address], [place], [place] [address]  Magnesium     Status: Abnormal   Collection Time: 07/23/21  1:09 PM  Result Value Ref Range   Magnesium 2.6 (H) 1.7 - 2.4 mg/dL    Comment: Performed at [facility], [address], [place], [place] [address]  Phosphorus     Status: None   Collection Time: 07/23/21  1:09 PM  Result Value Ref Range   Phosphorus 3.5 2.5 - 4.6 mg/dL    Comment: Performed at [facility], [address], [place], [place] [address]  Basic metabolic panel     Status: Abnormal   Collection Time: 07/24/21  5:04 AM  Result Value Ref Range   Sodium 144 135 - 145 mmol/L   Potassium 3.2 (L) 3.5 - 5.1 mmol/L   Chloride 109 98 - 111 mmol/L   CO2 27 22 - 32 mmol/L   Glucose, Bld 85 70 - 99 mg/dL    Comment: Glucose reference range applies only to samples taken after fasting for at least 8 hours.   BUN 12 8 - 23 mg/dL   Creatinine, Ser 0.81 0.61 - 1.24 mg/dL   Calcium 8.5 (L) 8.9 - 10.3 mg/dL   GFR, Estimated >60 >60 mL/min    Comment: (NOTE) Calculated using the CKD-EPI Creatinine Equation (2021)    Anion gap 8 5 - 15    Comment: Performed at [facility], [address] [place], [place] [address]  CBC     Status: None   Collection Time: 07/24/21  5:04 AM  Result Value Ref Range   WBC 10.1 4.0 - 10.5 K/uL   RBC 4.64 4.22 - 5.81 MIL/uL   Hemoglobin 14.2 13.0 -  17.0 g/dL   HCT 43.2 39.0  - 52.0 %   MCV 93.1 80.0 - 100.0 fL   MCH 30.6 26.0 - 34.0 pg   MCHC 32.9 30.0 - 36.0 g/dL   RDW 15.4 11.5 - 15.5 %   Platelets 258 150 - 400 K/uL   nRBC 0.0 0.0 - 0.2 %    Comment: Performed at [facility], [address] [place], [place] [address]  Protime-INR     Status: None   Collection Time: 07/24/21  5:04 AM  Result Value Ref Range   Prothrombin Time 14.2 11.4 - 15.2 seconds   INR 1.1 0.8 - 1.2    Comment: (NOTE) INR goal varies based on device and disease states. Performed at [facility], [address] [place], [place] [address]   APTT     Status: None   Collection Time: 07/24/21  5:04 AM  Result Value Ref Range   aPTT 34 24 - 36 seconds    Comment: Performed at [facility], [address], [place], [place] [address]  Glucose, capillary     Status: None   Collection Time: 07/24/21  7:17 AM  Result Value Ref Range   Glucose-Capillary 96 70 - 99 mg/dL    Comment: Glucose reference range applies only to samples taken after fasting for at least 8 hours.    CT Head Wo Contrast  Result Date: 07/23/2021 CLINICAL DATA:  Mental status change EXAM: CT HEAD WITHOUT CONTRAST TECHNIQUE: Contiguous axial images were obtained from the base of the skull through the vertex without intravenous contrast. RADIATION DOSE REDUCTION: This exam was performed according to the departmental dose-optimization program which includes automated exposure control, adjustment of the mA and/or kV according to patient size and/or use of iterative reconstruction technique. COMPARISON:  CT head 06/24/2010 FINDINGS: Brain: No evidence of acute infarction, hemorrhage, hydrocephalus, extra-axial collection or mass lesion/mass effect. Ill-defined hypoattenuation within the cerebral white matter is nonspecific but consistent with chronic small vessel ischemic disease. Mild cerebral volume loss. Chronic infarct right thalamus. Vascular: No hyperdense vessel or unexpected calcification. Skull: Normal. Negative for  fracture or focal lesion. Sinuses/Orbits: Mucous retention cyst left maxillary sinus. The paranasal sinuses and mastoid air cells are otherwise well aerated. Other: None. IMPRESSION: No acute intracranial abnormality. Electronically Signed   By: Placido Sou M.D.   On: 07/23/2021 12:52   CT HIP RIGHT WO CONTRAST  My interpretation of the CT scan is that the patient has a subcapital femoral neck fracture nondisplaced  Result Date: 07/23/2021 CLINICAL DATA:  Right hip pain with irregularity on radiography, for further characterization. EXAM: CT OF THE RIGHT HIP WITHOUT CONTRAST TECHNIQUE: Multidetector CT imaging of the right hip was performed according to the standard protocol. Multiplanar CT image reconstructions were also generated. RADIATION DOSE REDUCTION: This exam was performed according to the departmental dose-optimization program which includes automated exposure control, adjustment of the mA and/or kV according to patient size and/or use of iterative reconstruction technique. COMPARISON:  Radiographs 07/23/2021 and CT pelvis 08/03/2010 FINDINGS: Bones/Joint/Cartilage Sharp irregularity in the subcapital region of the right femoral neck along the growth plate line margin strongly favors a nondisplaced subcapital fracture over degenerative spurring. Moreover, posteriorly along the right femoral neck, there is linear lucency on image 67 series 6 suspicious for a small amount of longitudinal nondisplaced fracture in the right femoral neck. Trace right hip effusion. No other acute regional fracture identified. Bridging spurring of the right sacroiliac joint anteriorly. There is evidence of spondylosis at  L5-S1 only partially included on today's exam. Ligaments Suboptimally assessed by CT. Muscles and Tendons There is some localized thickening along the right edema less muscles as on image 57 series 5, cannot exclude local hematoma. 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 Soft tissues Iliac, common femoral, and superficial femoral artery atherosclerotic calcification. IMPRESSION: 1. Essentially nondisplaced subcapital right femoral neck fracture. Subtle linear component of fracture extends longitudinally along the posterior right femoral neck on image 67 series 6. Small right hip joint effusion. Possible adjacent mild hematoma along the right gemellus musculature. 2. 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 3. Atherosclerosis. Electronically Signed   By: Van Clines M.D.   On: 07/23/2021 09:44   DG Hip Unilat W or Wo Pelvis 2-3 Views Right  My interpretation of the plain film is that the patient has a subcapital femoral neck fracture nondisplaced  Result Date: 07/23/2021 CLINICAL DATA:  Pain, no known injury EXAM: DG HIP (WITH OR WITHOUT PELVIS) 3V RIGHT COMPARISON:  None Available. FINDINGS: Subcapital cortical irregularity of the right proximal femur. Mild degenerative changes of the bilateral hips and SI joints. Vascular calcifications. IMPRESSION: Subcapital cortical irregularity of the right proximal femur, likely due to nondisplaced fracture. Recommend further evaluation with cross-sectional imaging. Electronically Signed   By: Yetta Glassman M.D.   On: 07/23/2021 08:34    Review of Systems could not assess due to patient's mental status Blood pressure (!) 151/86, pulse (!) 52, temperature 97.6 F (36.4 C), resp. rate 18, height _0  (1.753 m), weight 65.3 kg, SpO2 99 %. Physical Exam  Constitutional: Appearance ectomorphic no gross deformity  Cardiovascular no swelling no varicosities palpation of pulses temperature edema tenderness normal  Lymph nodes groin negative  Skin normal x4 extremities  Neuro coordination, deep tendon reflexes, sensation  Psych alert and oriented to person place but not time no evidence of depression anxiety agitation  Musculoskeletal gait unable to walk  Left lower  extremity and right and left upper extremity inspection and palpation reveals no abnormalities.  Functional range of motion.  No evidence of joint subluxation, muscle tone normal no tremor   Right lower extremity  Leg lengths look equal and there is no gross external rotation deformity is tender in the proximal hip has painful range of motion no instability and normal muscle tone     Assessment/Plan: Hypokalemia potassium 3.2 Right hip fracture  The procedure has been fully reviewed with the patient; The risks and benefits of surgery have been discussed and explained and understood. Alternative treatment has also been reviewed, questions were encouraged and answered. The postoperative plan is also been reviewed.  I called his daughter she was able to speak to me regarding the risk of surgery versus nonoperative treatment which is progression of the fracture which would require hip replacement with increased risk of complications and more surgery  His daughter opted for cannulated screw fixation right hip/open treatment internal fixation right hip  I will try to correct his potassium before surgery.     Arther Rathel 07/24/2021, 8:15 AM

## 2021-07-24 NOTE — Anesthesia Preprocedure Evaluation (Addendum)
Anesthesia Evaluation  Patient identified by MRN, date of birth, ID band Patient awake    Reviewed: Allergy & Precautions, NPO status , Patient's Chart, lab work & pertinent test results  Airway Mallampati: III  TM Distance: >3 FB Neck ROM: Full    Dental  (+) Dental Advisory Given, Missing, Chipped, Poor Dentition   Pulmonary shortness of breath and with exertion, Current Smoker (heavy smoker) and Patient abstained from smoking.,    Pulmonary exam normal breath sounds clear to auscultation       Cardiovascular hypertension, Pt. on medications Normal cardiovascular exam Rhythm:Regular Rate:Normal  19-Jul-2021 16:45:39 [facility] ROUTINE RECORD 1955-02-13 (65 yr) Male Black Vent. rate 68 BPM PR interval 163 ms QRS duration 90 ms QT/QTcB 446/475 ms P-R-T axes 72 18 -43 Sinus rhythm Atrial premature complexes in couplets Anteroseptal infarct, old Borderline repolarization abnormality Confirmed by Fredia Sorrow [phone]) on 07/19/2021 4:56:48 PM   Neuro/Psych  Neuromuscular disease CVA (??) negative psych ROS   GI/Hepatic negative GI ROS, (+)     substance abuse (heavy alcohol use)  alcohol use and marijuana use,   Endo/Other  negative endocrine ROS  Renal/GU negative Renal ROS  negative genitourinary   Musculoskeletal negative musculoskeletal ROS (+)   Abdominal   Peds negative pediatric ROS (+)  Hematology  (+) Blood dyscrasia, anemia ,   Anesthesia Other Findings   Reproductive/Obstetrics negative OB ROS                           Anesthesia Physical Anesthesia Plan  ASA: 3  Anesthesia Plan: General   Post-op Pain Management: Dilaudid IV   Induction:   PONV Risk Score and Plan: 3 and Ondansetron and Dexamethasone  Airway Management Planned: LMA  Additional Equipment:   Intra-op Plan:   Post-operative Plan: Extubation in OR  Informed Consent: I  have reviewed the patients History and Physical, chart, labs and discussed the procedure including the risks, benefits and alternatives for the proposed anesthesia with the patient or authorized representative who has indicated his/her understanding and acceptance.     Dental advisory given  Plan Discussed with: CRNA and Surgeon  Anesthesia Plan Comments: (Patient has right sided weakness, daughter doesn't know the etiology for weakness. Negative for stroke in CT. Will proceed with procedure under GA with airway. Family agreed with the plan.)       Anesthesia Quick Evaluation

## 2021-07-24 NOTE — Brief Op Note (Signed)
07/23/2021 - 07/24/2021  2:18 PM  PATIENT:  Austin [name]  66 y.o. male  PRE-OPERATIVE DIAGNOSIS:  right hip fracture  POST-OPERATIVE DIAGNOSIS:  right hip fracture  PROCEDURE:  Procedure(s): CANNULATED HIP PINNING (Right)  Implants Biomet cannulated screws x4 with 4 washers the 2 superior screws are 80 mm the neck screw is 90 mm in the neck screw was 90 mm  Surgical findings nondisplaced fracture right femoral neck, subcapital  Details of procedure the patient was seen in the preop area evaluated cleared for surgery.  The surgical site was confirmed using x-rays as the patient is confused  Patient was taken the operating room for general anesthesia and a Foley catheter insertion.  He was placed on the fracture table his right leg was placed in traction his left leg was extended and abducted  The C-arm was brought in the fracture was stable and reduced in its current position.  The leg was internally rotated to assist with implant position  After sterile prep and drape and timeout.  A lateral incision was made just inferior to the greater trochanter the subcutaneous tissue was divided in line with the skin incision.  The fascia was then divided.  The vastus lateralis fascia was split in blunt finger dissection was carried out down to the lateral femur at which time the periosteal elevator was used to expose the bone  I retractor was placed  4 pins were placed using C arm guidance.  We initially started with 3 pins and then an additional inferior pin was placed to prevent varus collapse  Each pin was placed x-rayed.  The lateral cortex was drilled.  Screw and washer was placed by power and then by hand  Final imaging confirmed hardware placement fracture remained reduced  The wound was irrigated and closed in layered fashion with 0 Monocryl, #1 Braylon, 0 Monocryl 2-0 Monocryl subcuticular with benzoin and Steri-Strips followed by sterile dressing  The patient was then placed on  a regular bed  Postop plan weightbearing as tolerated  X-rays can be done at week #4 and 12  First postop visit at postop week #4 DVT prophylaxis for 30 days after discharge    SURGEON:  Surgeon(s) and Role:    * Carole Civil, MD - Primary  PHYSICIAN ASSISTANT:   ASSISTANTS: none   ANESTHESIA:   general  EBL:  100 mL   BLOOD ADMINISTERED:none  DRAINS: none   LOCAL MEDICATIONS USED:  MARCAINE     SPECIMEN:  No Specimen  DISPOSITION OF SPECIMEN:  N/A  COUNTS:  YES  TOURNIQUET:  * No tourniquets in log *  DICTATION: .Dragon Dictation  PLAN OF CARE: Admit to inpatient   PATIENT DISPOSITION:  PACU - hemodynamically stable.   Delay start of Pharmacological VTE agent (>24hrs) due to surgical blood loss or risk of bleeding: not applicable

## 2021-07-24 NOTE — Anesthesia Procedure Notes (Signed)
Procedure Name: LMA Insertion Date/Time: 07/24/2021 12:36 PM  Performed by: Karna Dupes, CRNAPre-anesthesia Checklist: Emergency Drugs available, Patient identified, Suction available and Patient being monitored Patient Re-evaluated:Patient Re-evaluated prior to induction Oxygen Delivery Method: Circle system utilized Preoxygenation: Pre-oxygenation with 100% oxygen Induction Type: IV induction LMA: LMA inserted LMA Size: 4.0 Number of attempts: 1 Placement Confirmation: positive ETCO2 and breath sounds checked- equal and bilateral Tube secured with: Tape Dental Injury: Teeth and Oropharynx as per pre-operative assessment

## 2021-07-24 NOTE — Progress Notes (Signed)
   07/24/21 1800  Vitals  Temp 98.4 F (36.9 C)  Temp Source Oral  BP (!) 148/98  MAP (mmHg) 111  BP Location Right Arm  BP Method Automatic  Patient Position (if appropriate) Lying  Pulse Rate (!) 134  Pulse Rate Source Monitor  ECG Heart Rate (!) 134  Resp 14  Level of Consciousness  Level of Consciousness Alert  MEWS COLOR  MEWS Score Color Yellow  Oxygen Therapy  SpO2 99 %  O2 Device Room Air  MEWS Score  MEWS Temp 0  MEWS Systolic 0  MEWS Pulse 3  MEWS RR 0  MEWS LOC 0  MEWS Score 3

## 2021-07-24 NOTE — Transfer of Care (Signed)
Immediate Anesthesia Transfer of Care Note  Patient: Austin [name]  Procedure(s) Performed: CANNULATED HIP PINNING (Right: Hip)  Patient Location: PACU  Anesthesia Type:General  Level of Consciousness: awake  Airway & Oxygen Therapy: Patient Spontanous Breathing and Patient connected to nasal cannula oxygen  Post-op Assessment: Report given to RN and Post -op Vital signs reviewed and stable  Post vital signs: Reviewed and stable  Last Vitals:  Vitals Value Taken Time  BP    Temp    Pulse    Resp    SpO2      Last Pain:  Vitals:   07/24/21 1203  TempSrc: Oral  PainSc: 0-No pain      Patients Stated Pain Goal: 5 (14/23/95 3202)  Complications: No notable events documented.

## 2021-07-24 NOTE — Progress Notes (Signed)
Patient arrived back from surgery, stable vitals. Patient alert with confusion. Daughter at bedside. No complaints of pain. Ice pack applied to surgical site, scds placed on patient and iv fluids infusing. Patient received last two KCL infusions in tra op.

## 2021-07-25 DIAGNOSIS — S72001A Fracture of unspecified part of neck of right femur, initial encounter for closed fracture: Secondary | ICD-10-CM | POA: Diagnosis not present

## 2021-07-25 LAB — CBC
HCT: 41.9 % (ref 39.0–52.0)
Hemoglobin: 13.7 g/dL (ref 13.0–17.0)
MCH: 30.1 pg (ref 26.0–34.0)
MCHC: 32.7 g/dL (ref 30.0–36.0)
MCV: 92.1 fL (ref 80.0–100.0)
Platelets: 244 10*3/uL (ref 150–400)
RBC: 4.55 MIL/uL (ref 4.22–5.81)
RDW: 14.7 % (ref 11.5–15.5)
WBC: 13.9 10*3/uL — ABNORMAL HIGH (ref 4.0–10.5)
nRBC: 0 % (ref 0.0–0.2)

## 2021-07-25 LAB — BASIC METABOLIC PANEL
Anion gap: 7 (ref 5–15)
BUN: 9 mg/dL (ref 8–23)
CO2: 24 mmol/L (ref 22–32)
Calcium: 8.1 mg/dL — ABNORMAL LOW (ref 8.9–10.3)
Chloride: 107 mmol/L (ref 98–111)
Creatinine, Ser: 0.77 mg/dL (ref 0.61–1.24)
GFR, Estimated: >60 mL/min (ref >60)
Glucose, Bld: 92 mg/dL (ref 70–99)
Potassium: 3.5 mmol/L (ref 3.5–5.1)
Sodium: 138 mmol/L (ref 135–145)

## 2021-07-25 LAB — GLUCOSE, CAPILLARY: Glucose-Capillary: 105 mg/dL — ABNORMAL HIGH (ref 70–99)

## 2021-07-25 MED ORDER — METOPROLOL SUCCINATE ER 25 MG PO TB24
25.0000 mg | ORAL_TABLET | Freq: Every day | ORAL | Status: DC
  Administered 2021-07-25 – 2021-07-26 (×2): 25 mg via ORAL
  Filled 2021-07-25 (×2): qty 1

## 2021-07-25 NOTE — Evaluation (Signed)
Occupational Therapy Evaluation Patient Details Name: Austin [name] MRN: [id number] DOB: [birth year] Today's Date: 07/25/2021   History of Present Illness Austin [name] is a 66 year old male with history of alcohol use and abuse and multiple ED visits presenting today with generalized weaknesses and right hip pain.  Patient's sister brought the patient to the ED thinking he is also dehydrated slower than normal, not ambulating much, complaining of right hip pain..  She reports that he drinks about 2-3 40's daily did not had a drink since yesterday.     No recent illnesses, unable to clarify time of falls. CANNULATED HIP PINNING (Right)   Clinical Impression   Pt agreeable to OT and PT co-evaluation. Pt demonstrates good bed mobility and B UE strength. More difficulty noted with lower body tasks needing moderate assist for lower body dressing and min to mod A for transfers mostly due to assist to boost from EOB and slow labored movement using RW when ambulating in the room. Pt has minimal support at home from daughter. Reportedly she checks in on him for 20 to 30 minutes a day. Pt would require more support than this at home at current functional level. Pt was left seated at EOB with tray in front with call bell within reach. Pt will benefit from continued OT in the hospital and recommended venue below to increase strength, balance, and endurance for safe ADL's.          Recommendations for follow up therapy are one component of a multi-disciplinary discharge planning process, led by the attending physician.  Recommendations may be updated based on patient status, additional functional criteria and insurance authorization.   Follow Up Recommendations  Skilled nursing-short term rehab (<3 hours/day)    Assistance Recommended at Discharge Intermittent Supervision/Assistance  Patient can return home with the following A little help with walking and/or transfers;A little help with  bathing/dressing/bathroom;Assistance with cooking/housework;Help with stairs or ramp for entrance;Assist for transportation    Functional Status Assessment  Patient has had a recent decline in their functional status and demonstrates the ability to make significant improvements in function in a reasonable and predictable amount of time.  Equipment Recommendations  None recommended by OT    Recommendations for Other Services       Precautions / Restrictions Precautions Precautions: Fall Restrictions Weight Bearing Restrictions: Yes RLE Weight Bearing: Weight bearing as tolerated      Mobility Bed Mobility Overal bed mobility: Modified Independent             General bed mobility comments: Mild labored movement.    Transfers Overall transfer level: Needs assistance Equipment used: Rolling walker (2 wheels) Transfers: Sit to/from Stand, Bed to chair/wheelchair/BSC Sit to Stand: Min assist, Mod assist     Step pivot transfers: Min assist, Mod assist     General transfer comment: Assited needed to boost from EOB with RW and to return to bed slowly lowering to bed. Labored steps forwared and backward in room with RW.      Balance Overall balance assessment: Needs assistance Sitting-balance support: No upper extremity supported, Feet supported Sitting balance-Leahy Scale: Good Sitting balance - Comments: seated EOB   Standing balance support: Bilateral upper extremity supported, During functional activity, Reliant on assistive device for balance Standing balance-Leahy Scale: Fair Standing balance comment: using RW                           ADL either performed or  assessed with clinical judgement   ADL Overall ADL's : Needs assistance/impaired     Grooming: Min guard;Minimal assistance;Standing   Upper Body Bathing: Independent   Lower Body Bathing: Moderate assistance;Sitting/lateral leans       Lower Body Dressing: Moderate  assistance;Sitting/lateral leans;Bed level Lower Body Dressing Details (indicate cue type and reason): Pt unable to don sock on R LE but was able to do L while in bed. Toilet Transfer: Minimal assistance;Moderate assistance;Rolling walker (2 wheels) Toilet Transfer Details (indicate cue type and reason): Simulated via sit to stand from EOB, ambulation in room, and return to bed. Toileting- Clothing Manipulation and Hygiene: Supervision/safety;Sitting/lateral lean               Vision Baseline Vision/History: 0 No visual deficits Ability to See in Adequate Light: 0 Adequate Patient Visual Report: No change from baseline Vision Assessment?: No apparent visual deficits                Pertinent Vitals/Pain Pain Assessment Pain Assessment: Faces Faces Pain Scale: Hurts a little bit Pain Location: R hip Pain Descriptors / Indicators: Discomfort Pain Intervention(s): Limited activity within patient's tolerance, Monitored during session, Repositioned     Hand Dominance Right   Extremity/Trunk Assessment Upper Extremity Assessment Upper Extremity Assessment: Overall WFL for tasks assessed   Lower Extremity Assessment Lower Extremity Assessment: Defer to PT evaluation   Cervical / Trunk Assessment Cervical / Trunk Assessment: Normal   Communication Communication Communication: No difficulties   Cognition Arousal/Alertness: Awake/alert Behavior During Therapy: WFL for tasks assessed/performed Overall Cognitive Status: Within Functional Limits for tasks assessed                                                        Home Living Family/patient expects to be discharged to:: Private residence Living Arrangements: Alone Available Help at Discharge: Family;Available PRN/intermittently Type of Home: House Home Access: Stairs to enter [employer] of Steps: 1 Entrance Stairs-Rails: Right Home Layout: One level     Bathroom Shower/Tub:  Teacher, early years/pre: Standard Bathroom Accessibility: Yes How Accessible: Accessible via walker Home Equipment: None          Prior Functioning/Environment Prior Level of Function : Needs assist             Mobility Comments: [place] ambulator without AD ADLs Comments: Independent ADL's with some assist for IADL's of cooking and cleaning. Daughter gets groceries. Pt does not drive.        OT Problem List: Decreased strength;Decreased activity tolerance;Impaired balance (sitting and/or standing)      OT Treatment/Interventions: Self-care/ADL training;Therapeutic exercise;Therapeutic activities;Patient/family education;Balance training;DME and/or AE instruction    OT Goals(Current goals can be found in the care plan section) Acute Rehab OT Goals Patient Stated Goal: return home OT Goal Formulation: With patient Time For Goal Achievement: 08/08/21 Potential to Achieve Goals: Good  OT Frequency: Min 2X/week    Co-evaluation PT/OT/SLP Co-Evaluation/Treatment: Yes Reason for Co-Treatment: To address functional/ADL transfers   OT goals addressed during session: ADL's and self-care                       End of Session Equipment Utilized During Treatment: Rolling walker (2 wheels)  Activity Tolerance: Patient tolerated treatment well Patient left: in bed;with call bell/phone within reach  OT Visit Diagnosis: Unsteadiness on feet (R26.81);Other abnormalities of gait and mobility (R26.89);Muscle weakness (generalized) (M62.81);History of falling (Z91.81)                Time: 9450-3888 OT Time Calculation (min): 16 min Charges:  OT General Charges $OT Visit: 1 Visit OT Evaluation $OT Eval Low Complexity: 1 Low  Sheyla Zaffino OT, MOT  Larey Seat 07/25/2021, 9:10 AM

## 2021-07-25 NOTE — Progress Notes (Signed)
Patient ID: Forestine Na, male   DOB: [birth year], 66 y.o.   MRN: [id number]  Postop day 1 status post open treatment internal fixation of the right hip for right femoral neck fracture  Patient sitting up in bed had his breakfast  His pain seems under control.  He is awake alert and oriented x3 his dressing is without any major drainage  Recommend physical therapy advance as tolerated weight-bear as tolerated DVT prophylaxis for 30 days after discharge  07/24/2021  PRE-OPERATIVE DIAGNOSIS:  right hip fracture  POST-OPERATIVE DIAGNOSIS:  right hip fracture  PROCEDURE:  Procedure(s): CANNULATED HIP PINNING (Right)  Implants Biomet cannulated screws x4 with 4 washers the 2 superior screws are 80 mm the neck screw is 90 mm in the neck screw was 90 mm  Surgical findings nondisplaced fracture right femoral neck, subcapital   Postop plan weightbearing as tolerated  X-rays can be done at week #4 and 12  First postop visit at postop week #4 DVT prophylaxis for 30 days after discharge

## 2021-07-25 NOTE — TOC Initial Note (Addendum)
Transition of Care [facility]) - Initial/Assessment Note    Patient Details  Name: Austin [name] MRN: [id number] Date of Birth: 19-Apr-1955  Transition of Care [facility]) CM/SW Contact:    Ihor Gully, LCSW Phone Number: 07/25/2021, 1:50 PM  Clinical Narrative:                 Patient from home alone. Does not drive. Daughter takes to appointments. Has a cane, does not use it, but is supposed to due to right sided weakness. Maintains medical appointments. Discussed PT recommendation with daughter (patient deferred to daughter). Sent SNF referral to facilities requested.  Patient provided SA resources.   Expected Discharge Plan: Skilled Nursing Facility Barriers to Discharge: Continued Medical Work up   Patient Goals and CMS Choice Patient states their goals for this hospitalization and ongoing recovery are:: rehab then home   Choice offered to / list presented to : Adult Children  Expected Discharge Plan and Services Expected Discharge Plan: [place] Acute Care Choice: [place] Living arrangements for the past 2 months: Single Family Home                                      Prior Living Arrangements/Services Living arrangements for the past 2 months: Single Family Home Lives with:: Self Patient language and need for interpreter reviewed:: Yes Do you feel safe going back to the place where you live?: Yes      Need for Family Participation in Patient Care: Yes (Comment) Care giver support system in place?: Yes (comment)   Criminal Activity/Legal Involvement Pertinent to Current Situation/Hospitalization: No - Comment as needed  Activities of Daily Living Home Assistive Devices/Equipment: None ADL Screening (condition at time of admission) Patient's cognitive ability adequate to safely complete daily activities?: Yes Is the patient deaf or have difficulty hearing?: No Does the patient have difficulty seeing, even when  wearing glasses/contacts?: No Does the patient have difficulty concentrating, remembering, or making decisions?: Yes Patient able to express need for assistance with ADLs?: Yes Does the patient have difficulty dressing or bathing?: Yes Independently performs ADLs?: Yes (appropriate for developmental age) Does the patient have difficulty walking or climbing stairs?: Yes Weakness of Legs: Right Weakness of Arms/Hands: None  Permission Sought/Granted Permission sought to share information with : Family Supports    Share Information with NAME: daughter, Jonelle Sidle           Emotional Assessment     Affect (typically observed): Appropriate Orientation: : Oriented to Place, Oriented to Self, Oriented to Situation Alcohol / Substance Use: Not Applicable Psych Involvement: No (comment)  Admission diagnosis:  ETOH abuse [F10.10] Hip fx, right, closed, initial encounter ([place]) [S72.001A] Patient Active Problem List   Diagnosis Date Noted   Hip fx, right, closed, initial encounter ([place]) 07/23/2021   EtOH dependence ([place]) 07/23/2021   Hypokalemia 07/23/2021    Class: Acute   Essential hypertension 07/23/2021    Class: Chronic   Pressure injury of skin 07/23/2021   Ribs, multiple fractures 07/10/2010   Spleen laceration extending into parenchyma without mention of open wound into cavity 07/10/2010   Anemia due to blood loss, acute 07/10/2010   PCP:  Carrolyn Meiers, MD Pharmacy:   [place], [place] - [address] [place] [address] Phone: [phone] Fax: [phone]  Social Determinants of Health (SDOH) Interventions    Readmission Risk Interventions     No data to display           

## 2021-07-25 NOTE — Progress Notes (Signed)
PROGRESS NOTE    Patient: Austin [name]                            PCP: Carrolyn Meiers, MD                    DOB: 11-24-1955            DOA: 07/23/2021 [id number]             DOS: 07/25/2021, 11:30 AM   LOS: 2 days   Date of Service: The patient was seen and examined on 07/25/2021  Subjective:   Patient was seen and examined, stable no acute distress  Postop day #1 status post ORIF right hip Tolerated procedure well  1 episode of tachycardia overnight  Awake alert oriented stable this morning  Brief Narrative:   Austin [name] is a 66 year old male with history of alcohol use and abuse and multiple ED visits presenting today with generalized weaknesses and right hip pain. Patient's sister brought the patient to the ED thinking he is also dehydrated slower than normal, not ambulating much, complaining of right hip pain..  She reports that he drinks about 2-3 40's daily did not had a drink since yesterday.  No recent illnesses, unable to clarify time of falls  ED: Blood pressure (!) 140/94, pulse 82, temperature 98.4 F (36.9 C), temperature source Oral, resp. rate 16, height '5\' 9"'$  (1.753 m), weight 70 kg, SpO2 97 %.  WBC 13.0, potassium 3.4, glucose 115, UA clear, EKG no acute changes, Hip chest x-ray suggested a possible fraction, recommended follow-up CT Which revealed subcapital right femoral fracture of the hip, nondisplaced    Assessment & Plan:   Principal Problem:   Hip fx, right, closed, initial encounter [facility]) Active Problems:   Essential hypertension   Hypokalemia   EtOH dependence ([place])   Pressure injury of skin     Assessment and Plan: * Hip fx, right, closed, initial encounter ([place]) -Postop day #1 -Status post ORIF 07/24/2021 by Dr. Aline Brochure  -Tolerated procedure well, stable  -Hip chest x-ray suggested a possible fraction, recommended follow-up CT Which revealed subcapital right femoral fracture of the hip,  nondisplaced  -Orthopedic, Dr. Aline Brochure -following  -As needed analgesics  Essential hypertension BP still mildly elevated -Apparently he was supposed to take Norvasc 5 (may not have been taking it) -Restarting Norvasc at 10 mg p.o. daily -Added metoprolol 25 mg p.o. daily today 07/25/2021 -Monitoring closely, as needed hydralazine   Hypokalemia -Potassium 3.4, 3.5  - Monitoring, repleted  EtOH dependence ([place]) -Monitoring closely, no signs of withdrawal at this point With exception of brief tachycardia overnight -CIWA protocol initiated  -Continue thiamine, folate supplements    -------------------------------------------------------------------------------------------------------------------------------------- Nutritional status:  The patient's BMI is: Body mass index is 21.06 kg/m. I agree with the assessment and plan as outlined   Skin Assessment: I have examined the patient's skin and I agree with the wound assessment as performed by wound care team As outlined belowe: Pressure Injury 07/23/21 Perineum Right;Left Stage 2 -  Partial thickness loss of dermis presenting as a shallow open injury with a red, pink wound bed without slough. two wounds located 2cm in length and 3cm in width (Active)  07/23/21 1344  Location: Perineum  Location Orientation: Right;Left  Staging: Stage 2 -  Partial thickness loss of dermis presenting as a shallow open injury with a red, pink wound bed without slough.  Wound  Description (Comments): two wounds located 2cm in length and 3cm in width  Present on Admission: Yes  Dressing Type None 07/23/21 2300    -------------------------------------------------------------------------------------------------------------------------------------------  DVT prophylaxis:  SCDs Start: 07/24/21 1451 Place TED hose Start: 07/24/21 1451 heparin injection 5,000 Units Start: 07/23/21 1400 TED hose Start: 07/23/21 1114 SCDs Start: 07/23/21  1114   Code Status:   Code Status: Full Code  Family Communication: Daughter at bedside bedside updated The above findings and plan of care has been discussed with patient (and family)  in detail,  they expressed understanding and agreement of above. -Advance care planning has been discussed.   Admission status:   Status is: Inpatient Remains inpatient appropriate because: Needing treatment for detox, Ortho surgical intervention for right hip fracture  Disposition: From home, will need SNF Ready for discharge and next 24 hours   Procedures:   No admission procedures for hospital encounter.   Antimicrobials:  Anti-infectives (From admission, onward)    Start     Dose/Rate Route Frequency Ordered Stop   07/24/21 1500  ceFAZolin (ANCEF) IVPB 2g/100 mL premix        2 g 200 mL/hr over 30 Minutes Intravenous Every 6 hours 07/24/21 1450 07/25/21 0022   07/24/21 1200  ceFAZolin (ANCEF) IVPB 2g/100 mL premix        2 g 200 mL/hr over 30 Minutes Intravenous On call to O.R. 07/24/21 1153 07/24/21 1238   07/24/21 1157  ceFAZolin (ANCEF) 2-4 GM/100ML-% IVPB       Note to Pharmacy: Abbie Sons S: cabinet override      07/24/21 1157 07/25/21 0022        Medication:   amLODipine  10 mg Oral Daily   aspirin EC  325 mg Oral Q breakfast   Chlorhexidine Gluconate Cloth  6 each Topical Daily   docusate sodium  100 mg Oral BID   folic acid  1 mg Oral Daily   heparin  5,000 Units Subcutaneous Q8H   LORazepam  0-4 mg Intravenous Q12H   metoprolol succinate  25 mg Oral Daily   multivitamin with minerals  1 tablet Oral Daily   potassium chloride  40 mEq Oral Once   sodium chloride flush  3 mL Intravenous Q12H   thiamine  100 mg Oral Daily   Or   thiamine  100 mg Intravenous Daily   traMADol  50 mg Oral Q6H    acetaminophen **OR** acetaminophen, alum & mag hydroxide-simeth, bisacodyl, hydrALAZINE, HYDROcodone-acetaminophen, HYDROmorphone (DILAUDID) injection, ipratropium,  levalbuterol, menthol-cetylpyridinium **OR** phenol, metoCLOPramide **OR** metoCLOPramide (REGLAN) injection, morphine injection, ondansetron **OR** ondansetron (ZOFRAN) IV, ondansetron **OR** ondansetron (ZOFRAN) IV, oxyCODONE, senna-docusate, sodium phosphate, traZODone   Objective:   Vitals:   07/24/21 1836 07/24/21 2311 07/25/21 0351 07/25/21 0500  BP:  (!) 158/94 (!) 164/95   Pulse: 80 71 72   Resp:  16    Temp:  98.1 F (36.7 C) 98.3 F (36.8 C)   TempSrc:  Oral Oral   SpO2:  100% 100%   Weight:    64.7 kg  Height:        Intake/Output Summary (Last 24 hours) at 07/25/2021 1130 Last data filed at 07/25/2021 1004 Gross per 24 hour  Intake 3794.53 ml  Output 1250 ml  Net 2544.53 ml   Filed Weights   07/23/21 0050 07/24/21 0700 07/25/21 0500  Weight: 70 kg 65.3 kg 64.7 kg     Examination:      Physical Exam:   General:  AAO x  3,  cooperative, no distress;   HEENT:  Normocephalic, PERRL, otherwise with in Normal limits   Neuro:  CNII-XII intact. , normal motor and sensation, reflexes intact   Lungs:   Clear to auscultation BL, Respirations unlabored,  No wheezes / crackles  Cardio:    S1/S2, RRR, No murmure, No Rubs or Gallops   Abdomen:  Soft, non-tender, bowel sounds active all four quadrants, no guarding or peritoneal signs.  Muscular  skeletal:  Right hip surgical wound dressing in place, range of motion limited due to pain and discomfort  Limited exam -global generalized weaknesses - in bed, able to move all 4 extremities,   2+ pulses,  symmetric, No pitting edema  Skin:  Dry, warm to touch, negative for any Rashes,  Wounds: Please see nursing documentation  Pressure Injury 07/23/21 Perineum Right;Left Stage 2 -  Partial thickness loss of dermis presenting as a shallow open injury with a red, pink wound bed without slough. two wounds located 2cm in length and 3cm in width (Active)  07/23/21 1344  Location: Perineum  Location Orientation: Right;Left   Staging: Stage 2 -  Partial thickness loss of dermis presenting as a shallow open injury with a red, pink wound bed without slough.  Wound Description (Comments): two wounds located 2cm in length and 3cm in width  Present on Admission: Yes  Dressing Type None 07/23/21 2300          ------------------------------------------------------------------------------------------------------------------------------------------    LABs:     Latest Ref Rng & Units 07/25/2021    5:05 AM 07/24/2021    5:04 AM 07/23/2021    1:34 AM  CBC  WBC 4.0 - 10.5 K/uL 13.9  10.1  13.0   Hemoglobin 13.0 - 17.0 g/dL 13.7  14.2  15.8   Hematocrit 39.0 - 52.0 % 41.9  43.2  47.6   Platelets 150 - 400 K/uL 244  258  281       Latest Ref Rng & Units 07/25/2021    5:05 AM 07/24/2021    5:04 AM 07/23/2021    1:34 AM  CMP  Glucose 70 - 99 mg/dL 92  85  115   BUN 8 - 23 mg/dL '9  12  13   '$ Creatinine 0.61 - 1.24 mg/dL 0.77  0.81  1.18   Sodium 135 - 145 mmol/L 138  144  144   Potassium 3.5 - 5.1 mmol/L 3.5  3.2  3.4   Chloride 98 - 111 mmol/L 107  109  109   CO2 22 - 32 mmol/L '24  27  28   '$ Calcium 8.9 - 10.3 mg/dL 8.1  8.5  9.0        Micro Results Recent Results (from the past 240 hour(s))  MRSA Next Gen by PCR, Nasal     Status: None   Collection Time: 07/24/21  9:33 AM   Specimen: Nasal Mucosa; Nasal Swab  Result Value Ref Range Status   MRSA by PCR Next Gen NOT DETECTED NOT DETECTED Final    Comment: (NOTE) The GeneXpert MRSA Assay (FDA approved for NASAL specimens only), is one component of a comprehensive MRSA colonization surveillance program. It is not intended to diagnose MRSA infection nor to guide or monitor treatment for MRSA infections. Test performance is not FDA approved in patients less than 16 years old. Performed at [facility], [address], [place],  [address]     Radiology Reports DG HIP UNILAT WITH PELVIS 2-3 VIEWS RIGHT  Result Date: 07/24/2021  CLINICAL DATA:   Postop fixation of right hip fracture. EXAM: DG HIP (WITH OR WITHOUT PELVIS) 2-3V RIGHT COMPARISON:  CT scan 07/23/2021 FINDINGS: There are 4 cannulated hip screws transfixing the subcapital fracture. No complicating features are identified. IMPRESSION: Internal fixation of the subcapital fracture. Electronically Signed   By: Marijo Sanes M.D.   On: 07/24/2021 14:58   DG HIP UNILAT WITH PELVIS 2-3 VIEWS RIGHT  Result Date: 07/24/2021 CLINICAL DATA:  Right hip pinning. EXAM: DG HIP (WITH OR WITHOUT PELVIS) 2-3V RIGHT COMPARISON:  Pelvis and right hip radiographs 07/23/2021 FINDINGS: Images were performed intraoperatively without the presence of a radiologist. The patient is undergoing fixation of the previously seen nondisplaced right subcapital femoral neck fracture with 3 longitudinal screws. Total fluoroscopy images: 8 Total fluoroscopy time: 144 seconds Total dose: Radiation Exposure Index (as provided by the fluoroscopic device): 33.02 mGy air Kerma Please see intraoperative findings for further detail. IMPRESSION: Intraoperative fluoroscopy for right femoral neck ORIF. Electronically Signed   By: Yvonne Kendall M.D.   On: 07/24/2021 14:05   DG C-Arm 1-60 Min-No Report  Result Date: 07/24/2021 Fluoroscopy was utilized by the requesting physician.  No radiographic interpretation.    SIGNED: Deatra James, MD, FHM. [facility],  Pager (please use [email or web address] to page/text) Please use Epic Secure Chat for non-urgent communication (7AM-7PM)  If 7PM-7AM, please contact night-coverage [email or web address], 07/25/2021, 11:30 AM

## 2021-07-25 NOTE — Plan of Care (Signed)
  Problem: Acute Rehab PT Goals(only PT should resolve) Goal: Patient Will Transfer Sit To/From Stand Outcome: Progressing Flowsheets (Taken 07/25/2021 1056) Patient will transfer sit to/from stand:  with min guard assist  with minimal assist Goal: Pt Will Transfer Bed To Chair/Chair To Bed Outcome: Progressing Flowsheets (Taken 07/25/2021 1056) Pt will Transfer Bed to Chair/Chair to Bed:  min guard assist  with min assist Goal: Pt Will Ambulate Outcome: Progressing Flowsheets (Taken 07/25/2021 1056) Pt will Ambulate:  25 feet  with min guard assist  with rolling walker Goal: Pt/caregiver will Perform Home Exercise Program Outcome: Progressing Flowsheets (Taken 07/25/2021 1056) Pt/caregiver will Perform Home Exercise Program:  For increased strengthening  For improved balance  Independently  10:56 AM, 07/25/21 Mearl Latin PT, DPT Physical Therapist at [facility]

## 2021-07-25 NOTE — Plan of Care (Signed)
  Problem: Acute Rehab OT Goals (only OT should resolve) Goal: Pt. Will Perform Grooming Flowsheets (Taken 07/25/2021 0915) Pt Will Perform Grooming:  with modified independence  standing  with adaptive equipment Goal: Pt. Will Perform Lower Body Bathing Flowsheets (Taken 07/25/2021 0915) Pt Will Perform Lower Body Bathing:  with modified independence  sitting/lateral leans  with adaptive equipment Goal: Pt. Will Perform Lower Body Dressing Flowsheets (Taken 07/25/2021 0915) Pt Will Perform Lower Body Dressing:  with modified independence  sitting/lateral leans  with adaptive equipment Goal: Pt. Will Transfer To Toilet Flowsheets (Taken 07/25/2021 0915) Pt Will Transfer to Toilet:  with modified independence  ambulating  Tatia Petrucci OT, MOT

## 2021-07-25 NOTE — NC FL2 (Signed)
[place] MEDICAID FL2 LEVEL OF CARE SCREENING TOOL     IDENTIFICATION  Patient Name: Austin [name] Birthdate: Sep 21, 1955 Sex: male Admission Date (Current Location): 07/23/2021  [facility] and [place] Number:  [employer] and Address:  [place] [address] [place]      Provider Number: [id number]  Attending Physician Name and Address:  Deatra James, MD  Relative Name and Phone Number:  Seago,TIFFANY (Daughter)   [phone]    Current Level of Care: Hospital Recommended Level of Care: [place] Prior Approval Number:    Date Approved/Denied:   PASRR Number: [id number]  Discharge Plan: SNF    Current Diagnoses: Patient Active Problem List   Diagnosis Date Noted   Hip fx, right, closed, initial encounter ([place]) 07/23/2021   EtOH dependence ([place]) 07/23/2021   Hypokalemia 07/23/2021   Essential hypertension 07/23/2021   Pressure injury of skin 07/23/2021   Ribs, multiple fractures 07/10/2010   Spleen laceration extending into parenchyma without mention of open wound into cavity 07/10/2010   Anemia due to blood loss, acute 07/10/2010    Orientation RESPIRATION BLADDER Height & Weight     Self, Place, Situation  Normal Incontinent Weight: 142 lb 10.2 oz (64.7 kg) Height:  '5\' 9"'$  (175.3 cm)  BEHAVIORAL SYMPTOMS/MOOD NEUROLOGICAL BOWEL NUTRITION STATUS      Incontinent Diet (regular)  AMBULATORY STATUS COMMUNICATION OF NEEDS Skin   Limited Assist Verbally Surgical wounds, PU Stage and Appropriate Care (Surgical wound: right hip; Stage 2: Perineum)                       Personal Care Assistance Level of Assistance  Feeding, Bathing, Dressing Bathing Assistance: Limited assistance Feeding assistance: Independent Dressing Assistance: Limited assistance     Functional Limitations Info  Sight, Hearing, Speech Sight Info: Adequate Hearing Info: Adequate Speech Info: Adequate    SPECIAL CARE  FACTORS FREQUENCY  PT (By licensed PT), OT (By licensed OT)     PT Frequency: 5x/week OT Frequency: 3x/week            Contractures Contractures Info: Not present    Additional Factors Info  Code Status, Allergies Code Status Info: Full Code Allergies Info: Fish allergy           Current Medications (07/25/2021):  This is the current hospital active medication list Current Facility-Administered Medications  Medication Dose Route Frequency Provider Last Rate Last Admin   0.9 %  sodium chloride infusion   Intravenous Continuous Carole Civil, MD 100 mL/hr at 07/25/21 0223 New Bag at 07/25/21 0223   acetaminophen (TYLENOL) tablet 650 mg  650 mg Oral Q6H PRN Shahmehdi, Valeria Batman, MD       Or   acetaminophen (TYLENOL) suppository 650 mg  650 mg Rectal Q6H PRN Shahmehdi, Seyed A, MD       alum & mag hydroxide-simeth (MAALOX/MYLANTA) 200-200-20 MG/5ML suspension 30 mL  30 mL Oral Q4H PRN Carole Civil, MD       amLODipine (NORVASC) tablet 10 mg  10 mg Oral Daily Shahmehdi, Seyed A, MD   10 mg at 07/25/21 4403   aspirin EC tablet 325 mg  325 mg Oral Q breakfast Carole Civil, MD   325 mg at 07/25/21 0806   bisacodyl (DULCOLAX) EC tablet 5 mg  5 mg Oral Daily PRN Deatra James, MD       Chlorhexidine Gluconate Cloth 2 % PADS 6 each  6 each Topical Daily Carole Civil, MD   6 each at 07/25/21 3151   docusate sodium (COLACE) capsule 100 mg  100 mg Oral BID Carole Civil, MD   100 mg at 76/16/07 3710   folic acid (FOLVITE) tablet 1 mg  1 mg Oral Daily Shahmehdi, Seyed A, MD   1 mg at 07/25/21 0806   heparin injection 5,000 Units  5,000 Units Subcutaneous Q8H Shahmehdi, Erling Conte A, MD   5,000 Units at 07/25/21 0537   hydrALAZINE (APRESOLINE) injection 10 mg  10 mg Intravenous Q4H PRN Shahmehdi, Valeria Batman, MD       HYDROcodone-acetaminophen (NORCO) 7.5-325 MG per tablet 1-2 tablet  1-2 tablet Oral Q4H PRN Carole Civil, MD       HYDROmorphone (DILAUDID)  injection 0.5-1 mg  0.5-1 mg Intravenous Q2H PRN Skipper Cliche A, MD   1 mg at 07/25/21 0831   ipratropium (ATROVENT) nebulizer solution 0.5 mg  0.5 mg Nebulization Q6H PRN Deatra James, MD       lactated ringers infusion   Intravenous Continuous Deatra James, MD 125 mL/hr at 07/24/21 0351 New Bag at 07/24/21 0351   levalbuterol (XOPENEX) nebulizer solution 0.63 mg  0.63 mg Nebulization Q6H PRN Shahmehdi, Seyed A, MD       LORazepam (ATIVAN) injection 0-4 mg  0-4 mg Intravenous Q12H Shahmehdi, Seyed A, MD       menthol-cetylpyridinium (CEPACOL) lozenge 3 mg  1 lozenge Oral PRN Carole Civil, MD       Or   phenol (CHLORASEPTIC) mouth spray 1 spray  1 spray Mouth/Throat PRN Carole Civil, MD       metoCLOPramide (REGLAN) tablet 5-10 mg  5-10 mg Oral Q8H PRN Carole Civil, MD       Or   metoCLOPramide (REGLAN) injection 5-10 mg  5-10 mg Intravenous Q8H PRN Carole Civil, MD       metoprolol succinate (TOPROL-XL) 24 hr tablet 25 mg  25 mg Oral Daily Shahmehdi, Seyed A, MD   25 mg at 07/25/21 0809   morphine (PF) 2 MG/ML injection 0.5-1 mg  0.5-1 mg Intravenous Q2H PRN Carole Civil, MD       multivitamin with minerals tablet 1 tablet  1 tablet Oral Daily Shahmehdi, Seyed A, MD   1 tablet at 07/25/21 0806   ondansetron (ZOFRAN) tablet 4 mg  4 mg Oral Q6H PRN Shahmehdi, Valeria Batman, MD       Or   ondansetron (ZOFRAN) injection 4 mg  4 mg Intravenous Q6H PRN Shahmehdi, Seyed A, MD       ondansetron (ZOFRAN) tablet 4 mg  4 mg Oral Q6H PRN Carole Civil, MD       Or   ondansetron [facility]) injection 4 mg  4 mg Intravenous Q6H PRN Carole Civil, MD       oxyCODONE (Oxy IR/ROXICODONE) immediate release tablet 5 mg  5 mg Oral Q4H PRN Shahmehdi, Seyed A, MD       potassium chloride SA (KLOR-CON M) CR tablet 40 mEq  40 mEq Oral Once Dorie Rank, MD       senna-docusate (Senokot-S) tablet 1 tablet  1 tablet Oral QHS PRN Shahmehdi, Seyed A, MD       sodium  chloride flush (NS) 0.9 % injection 3 mL  3 mL Intravenous Q12H Shahmehdi, Seyed A, MD   3 mL at 07/25/21 0808   sodium phosphate (FLEET) 7-19 GM/118ML enema 1 enema  1 enema Rectal Once PRN Shahmehdi, Seyed A, MD       thiamine tablet 100 mg  100 mg Oral Daily Shahmehdi, Seyed A, MD   100 mg at 07/25/21 6184   Or   thiamine (B-1) injection 100 mg  100 mg Intravenous Daily Shahmehdi, Seyed A, MD   100 mg at 07/24/21 1024   traMADol (ULTRAM) tablet 50 mg  50 mg Oral Q6H Carole Civil, MD   50 mg at 07/25/21 1209   traZODone (DESYREL) tablet 25 mg  25 mg Oral QHS PRN Deatra James, MD         Discharge Medications: Please see discharge summary for a list of discharge medications.  Relevant Imaging Results:  Relevant Lab Results:   Additional Information SSN [id number]; Per daughter, patient has been vaccinated for COVIDx2.  Govani Radloff, Clydene Pugh, LCSW

## 2021-07-25 NOTE — Progress Notes (Signed)
Foley catheter removed at 1030, urinal provided. Notified MD

## 2021-07-25 NOTE — Evaluation (Signed)
Physical Therapy Evaluation Patient Details Name: Austin [name] MRN: [id number] DOB: August 22, 1955 Today's Date: 07/25/2021  History of Present Illness  Austin [name] is a 66 year old male with history of alcohol use and abuse and multiple ED visits presenting today with generalized weaknesses and right hip pain.  Patient's sister brought the patient to the ED thinking he is also dehydrated slower than normal, not ambulating much, complaining of right hip pain..  She reports that he drinks about 2-3 40's daily did not had a drink since yesterday.     No recent illnesses, unable to clarify time of falls. CANNULATED HIP PINNING (Right)   Clinical Impression  Patient limited for functional mobility as stated below secondary to RLE weakness, fatigue and poor standing balance. Patient does not require assist for bed mobility but does require increased time. Patient requires min/mod assist to power up to standing with use of RW due to RLE weakness. He initially requires increased assist for balance but is able to maintain fairly well with use of RW after a minute of standing. He ambulates in room with shuffled cadence and is limited by fatigue. Patient will benefit from continued physical therapy in hospital and recommended venue below to increase strength, balance, endurance for safe ADLs and gait.        Recommendations for follow up therapy are one component of a multi-disciplinary discharge planning process, led by the attending physician.  Recommendations may be updated based on patient status, additional functional criteria and insurance authorization.  Follow Up Recommendations Skilled nursing-short term rehab (<3 hours/day) Can patient physically be transported by private vehicle: Yes    Assistance Recommended at Discharge Intermittent Supervision/Assistance  Patient can return home with the following  A little help with walking and/or transfers;A little help with  bathing/dressing/bathroom;Assistance with cooking/housework;Assist for transportation;Help with stairs or ramp for entrance    Equipment Recommendations None recommended by PT  Recommendations for Other Services       Functional Status Assessment Patient has had a recent decline in their functional status and demonstrates the ability to make significant improvements in function in a reasonable and predictable amount of time.     Precautions / Restrictions Precautions Precautions: Fall Restrictions Weight Bearing Restrictions: Yes RUE Weight Bearing: Weight bearing as tolerated RLE Weight Bearing: Weight bearing as tolerated      Mobility  Bed Mobility Overal bed mobility: Modified Independent             General bed mobility comments: Mild labored movement.    Transfers Overall transfer level: Needs assistance Equipment used: Rolling walker (2 wheels) Transfers: Sit to/from Stand, Bed to chair/wheelchair/BSC Sit to Stand: Min assist, Mod assist   Step pivot transfers: Min assist, Mod assist       General transfer comment: Assist needed to boost from EOB with RW    Ambulation/Gait Ambulation/Gait assistance: Min guard, Min assist Gait Distance (Feet): 20 Feet Assistive device: Rolling walker (2 wheels) Gait Pattern/deviations: Decreased stride length, Step-to pattern, Shuffle       General Gait Details: slow, labored cadence in room with RW; ambulates with forward steps to door and retro steps back to bed stating its "easier"  Stairs            Wheelchair Mobility    Modified Rankin (Stroke Patients Only)       Balance Overall balance assessment: Needs assistance Sitting-balance support: No upper extremity supported, Feet supported Sitting balance-Leahy Scale: Good Sitting balance - Comments: seated EOB  Standing balance support: Bilateral upper extremity supported, During functional activity, Reliant on assistive device for balance Standing  balance-Leahy Scale: Fair Standing balance comment: using RW                             Pertinent Vitals/Pain Pain Assessment Pain Assessment: Faces Faces Pain Scale: Hurts a little bit Pain Location: R hip Pain Descriptors / Indicators: Discomfort Pain Intervention(s): Limited activity within patient's tolerance, Monitored during session, Repositioned    Home Living Family/patient expects to be discharged to:: Private residence Living Arrangements: Alone Available Help at Discharge: Family;Available PRN/intermittently Type of Home: House Home Access: Stairs to enter Entrance Stairs-Rails: Right Entrance Stairs-Number of Steps: 1   Home Layout: One level Home Equipment: None      Prior Function Prior Level of Function : Needs assist             Mobility Comments: [place] ambulator without AD ADLs Comments: Independent ADL's with some assist for IADL's of cooking and cleaning. Daughter gets groceries. Pt does not drive.     Hand Dominance   Dominant Hand: Right    Extremity/Trunk Assessment   Upper Extremity Assessment Upper Extremity Assessment: Defer to OT evaluation    Lower Extremity Assessment Lower Extremity Assessment: Generalized weakness;RLE deficits/detail RLE Deficits / Details: RLE weakness consistent with post op status    Cervical / Trunk Assessment Cervical / Trunk Assessment: Normal  Communication   Communication: No difficulties  Cognition Arousal/Alertness: Awake/alert Behavior During Therapy: WFL for tasks assessed/performed Overall Cognitive Status: Within Functional Limits for tasks assessed                                          General Comments      Exercises     Assessment/Plan    PT Assessment Patient needs continued PT services  PT Problem List Decreased strength;Decreased mobility;Decreased activity tolerance;Decreased balance;Pain       PT Treatment Interventions DME  instruction;Therapeutic exercise;Gait training;Balance training;Stair training;Neuromuscular re-education;Functional mobility training;Therapeutic activities;Patient/family education    PT Goals (Current goals can be found in the Care Plan section)  Acute Rehab PT Goals Patient Stated Goal: return home PT Goal Formulation: With patient Time For Goal Achievement: 08/08/21 Potential to Achieve Goals: Good    Frequency Min 3X/week     Co-evaluation PT/OT/SLP Co-Evaluation/Treatment: Yes Reason for Co-Treatment: To address functional/ADL transfers PT goals addressed during session: Mobility/safety with mobility;Balance;Proper use of DME;Strengthening/ROM OT goals addressed during session: ADL's and self-care       AM-PAC PT "6 Clicks" Mobility  Outcome Measure Help needed turning from your back to your side while in a flat bed without using bedrails?: None Help needed moving from lying on your back to sitting on the side of a flat bed without using bedrails?: A Little Help needed moving to and from a bed to a chair (including a wheelchair)?: A Lot Help needed standing up from a chair using your arms (e.g., wheelchair or bedside chair)?: A Lot Help needed to walk in hospital room?: A Little Help needed climbing 3-5 steps with a railing? : A Lot 6 Click Score: 16    End of Session Equipment Utilized During Treatment: Gait belt Activity Tolerance: Patient tolerated treatment well;Patient limited by fatigue Patient left: in bed;with call bell/phone within reach;with bed alarm set Nurse Communication: Mobility status  PT Visit Diagnosis: Unsteadiness on feet (R26.81);Other abnormalities of gait and mobility (R26.89);Muscle weakness (generalized) (M62.81)    Time: 7711-6579 PT Time Calculation (min) (ACUTE ONLY): 17 min   Charges:   PT Evaluation $PT Eval Low Complexity: 1 Low PT Treatments $Therapeutic Activity: 8-22 mins        10:53 AM, 07/25/21 Mearl Latin PT,  DPT Physical Therapist at [facility]

## 2021-07-26 DIAGNOSIS — S72001A Fracture of unspecified part of neck of right femur, initial encounter for closed fracture: Secondary | ICD-10-CM | POA: Diagnosis not present

## 2021-07-26 LAB — CBC
HCT: 43.9 % (ref 39.0–52.0)
Hemoglobin: 14.5 g/dL (ref 13.0–17.0)
MCH: 30.9 pg (ref 26.0–34.0)
MCHC: 33 g/dL (ref 30.0–36.0)
MCV: 93.6 fL (ref 80.0–100.0)
Platelets: 236 10*3/uL (ref 150–400)
RBC: 4.69 MIL/uL (ref 4.22–5.81)
RDW: 15.1 % (ref 11.5–15.5)
WBC: 11.2 10*3/uL — ABNORMAL HIGH (ref 4.0–10.5)
nRBC: 0 % (ref 0.0–0.2)

## 2021-07-26 LAB — BASIC METABOLIC PANEL
Anion gap: 6 (ref 5–15)
BUN: 6 mg/dL — ABNORMAL LOW (ref 8–23)
CO2: 26 mmol/L (ref 22–32)
Calcium: 8.4 mg/dL — ABNORMAL LOW (ref 8.9–10.3)
Chloride: 108 mmol/L (ref 98–111)
Creatinine, Ser: 0.75 mg/dL (ref 0.61–1.24)
GFR, Estimated: >60 mL/min (ref >60)
Glucose, Bld: 91 mg/dL (ref 70–99)
Potassium: 3.7 mmol/L (ref 3.5–5.1)
Sodium: 140 mmol/L (ref 135–145)

## 2021-07-26 LAB — GLUCOSE, CAPILLARY: Glucose-Capillary: 120 mg/dL — ABNORMAL HIGH (ref 70–99)

## 2021-07-26 MED ORDER — ONDANSETRON HCL 4 MG PO TABS: 4.0000 mg | ORAL_TABLET | Freq: Four times a day (QID) | ORAL | 0 refills | Status: DC | PRN

## 2021-07-26 MED ORDER — TRAMADOL HCL 50 MG PO TABS: 50.0000 mg | ORAL_TABLET | Freq: Four times a day (QID) | ORAL | 0 refills | Status: AC

## 2021-07-26 MED ORDER — ASPIRIN 81 MG PO TBEC: 81.0000 mg | DELAYED_RELEASE_TABLET | Freq: Every day | ORAL | 0 refills | Status: AC

## 2021-07-26 MED ORDER — NICOTINE 14 MG/24HR TD PT24
14.0000 mg | MEDICATED_PATCH | Freq: Every day | TRANSDERMAL | Status: DC
  Administered 2021-07-26 – 2021-07-28 (×3): 14 mg via TRANSDERMAL
  Filled 2021-07-26 (×3): qty 1

## 2021-07-26 MED ORDER — THIAMINE HCL 100 MG PO TABS: 100.0000 mg | ORAL_TABLET | Freq: Every day | ORAL | 0 refills | Status: AC

## 2021-07-26 MED ORDER — DOCUSATE SODIUM 100 MG PO CAPS: 100.0000 mg | ORAL_CAPSULE | Freq: Two times a day (BID) | ORAL | 0 refills | Status: DC

## 2021-07-26 MED ORDER — NICOTINE 14 MG/24HR TD PT24: 14.0000 mg | MEDICATED_PATCH | Freq: Every day | TRANSDERMAL | 0 refills | Status: DC

## 2021-07-26 MED ORDER — AMLODIPINE BESYLATE 10 MG PO TABS: 10.0000 mg | ORAL_TABLET | Freq: Every day | ORAL | 0 refills | Status: DC

## 2021-07-26 MED ORDER — ACETAMINOPHEN 325 MG PO TABS: 650.0000 mg | ORAL_TABLET | Freq: Four times a day (QID) | ORAL | 1 refills | Status: AC | PRN

## 2021-07-26 MED ORDER — METOPROLOL SUCCINATE ER 25 MG PO TB24: 25.0000 mg | ORAL_TABLET | Freq: Every day | ORAL | 0 refills | Status: DC

## 2021-07-26 MED ORDER — TRAZODONE HCL 50 MG PO TABS: 25.0000 mg | ORAL_TABLET | Freq: Every evening | ORAL | 0 refills | Status: DC | PRN

## 2021-07-26 MED ORDER — SENNOSIDES-DOCUSATE SODIUM 8.6-50 MG PO TABS: 1.0000 | ORAL_TABLET | Freq: Every evening | ORAL | 0 refills | Status: AC | PRN

## 2021-07-26 MED ORDER — FOLIC ACID 1 MG PO TABS: 1.0000 mg | ORAL_TABLET | Freq: Every day | ORAL | 0 refills | Status: AC

## 2021-07-26 NOTE — Discharge Summary (Signed)
Physician Discharge Summary   Patient: Austin [name] MRN: [id number] DOB: 1955/11/16  Admit date:     07/23/2021  Discharge date: 07/26/21  Discharge Physician: Deatra James   PCP: Carrolyn Meiers, MD   Recommendations at discharge:  Follow-up with a PCP and orthopedic surgery within 1 week Continue current medication, aggressive PT OT, fall precautions  Discharge Diagnoses: Principal Problem:   Hip fx, right, closed, initial encounter [facility]) Active Problems:   Essential hypertension   Hypokalemia   EtOH dependence ([place])   Pressure injury of skin  Resolved Problems:   * No resolved hospital problems. [facility] Course: Austin [name] is a 66 year old male with history of alcohol use and abuse and multiple ED visits presenting today with generalized weaknesses and right hip pain. Patient's sister brought the patient to the ED thinking he is also dehydrated slower than normal, not ambulating much, complaining of right hip pain..  She reports that he drinks about 2-3 40's daily did not had a drink since yesterday.  No recent illnesses, unable to clarify time of falls  ED: Blood pressure (!) 140/94, pulse 82, temperature 98.4 F (36.9 C), temperature source Oral, resp. rate 16, height $RemoveBe'5\' 9"'QEkEvXiPt$  (1.753 m), weight 70 kg, SpO2 97 %.  WBC 13.0, potassium 3.4, glucose 115, UA clear, EKG no acute changes, Hip chest x-ray suggested a possible fraction, recommended follow-up CT Which revealed subcapital right femoral fracture of the hip, nondisplaced  Assessment and Plan: * Hip fx, right, closed, initial encounter ([place]) -Postop day # 2 -Status post ORIF 07/24/2021 by Dr. Aline Brochure  -Tolerated procedure well, stable  -Hip chest x-ray suggested a possible fraction, recommended follow-up CT Which revealed subcapital right femoral fracture of the hip, nondisplaced  -Orthopedic, Dr. Aline Brochure -following  -As needed analgesics  Essential hypertension BP still mildly  elevated -Apparently he was supposed to take Norvasc 5 (may not have been taking it) -Restarting Norvasc at 10 mg p.o. daily -Added metoprolol 25 mg p.o. daily today 07/25/2021 -Monitoring closely, as needed hydralazine   Hypokalemia -Potassium 3.4, 3.5  - Monitoring, repleted  EtOH dependence ([place]) -Monitoring closely, no signs of withdrawal at this point With exception of brief tachycardia overnight -CIWA protocol initiated  -Continue thiamine, folate supplements        Pain control - [place] Controlled Substance Reporting System database was reviewed. and patient was instructed, not to drive, operate heavy machinery, perform activities at heights, swimming or participation in water activities or provide baby-sitting services while on Pain, Sleep and Anxiety Medications; until their outpatient Physician has advised to do so again. Also recommended to not to take more than prescribed Pain, Sleep and Anxiety Medications.  Consultants: Orthopedic surgery Procedures performed: ORIF of right hip Disposition: Skilled nursing facility Diet recommendation:  Discharge Diet Orders (From admission, onward)     Start     Ordered   07/26/21 0000  Diet - low sodium heart healthy        07/26/21 1148           Regular diet DISCHARGE MEDICATION: Allergies as of 07/26/2021       Reactions   Fish Allergy Anaphylaxis        Medication List     TAKE these medications    acetaminophen 325 MG tablet Commonly known as: TYLENOL Take 2 tablets (650 mg total) by mouth every 6 (six) hours as needed for mild pain (or Fever >/= 101).   amLODipine 10 MG tablet Commonly  known as: NORVASC Take 1 tablet (10 mg total) by mouth daily. Start taking on: July 27, 2021 What changed:  medication strength how much to take   aspirin EC 81 MG tablet Take 1 tablet (81 mg total) by mouth daily. Swallow whole.   docusate sodium 100 MG capsule Commonly known as: COLACE Take 1 capsule  (100 mg total) by mouth 2 (two) times daily.   folic acid 1 MG tablet Commonly known as: FOLVITE Take 1 tablet (1 mg total) by mouth daily for 5 days. Start taking on: July 27, 2021   metoprolol succinate 25 MG 24 hr tablet Commonly known as: TOPROL-XL Take 1 tablet (25 mg total) by mouth daily. Start taking on: July 27, 2021   Na Sulfate-K Sulfate-Mg Sulf 17.5-3.13-1.6 GM/177ML Soln Commonly known as: Suprep Bowel Prep Kit Take 1 kit by mouth as directed.   ondansetron 4 MG tablet Commonly known as: ZOFRAN Take 1 tablet (4 mg total) by mouth every 6 (six) hours as needed for nausea.   senna-docusate 8.6-50 MG tablet Commonly known as: Senokot-S Take 1 tablet by mouth at bedtime as needed for up to 10 days for mild constipation.   thiamine 100 MG tablet Take 1 tablet (100 mg total) by mouth daily for 5 days. Start taking on: July 27, 2021   traMADol 50 MG tablet Commonly known as: ULTRAM Take 1 tablet (50 mg total) by mouth every 6 (six) hours for 5 days.   traZODone 50 MG tablet Commonly known as: DESYREL Take 0.5 tablets (25 mg total) by mouth at bedtime as needed for up to 20 days for sleep.               Discharge Care Instructions  (From admission, onward)           Start     Ordered   07/26/21 0000  Discharge wound care:       Comments: Per surgery/orthopedic surgery recommendations   07/26/21 1148            Follow-up Information     Schedule an appointment as soon as possible for a visit  with Carrolyn Meiers, MD.   Specialty: Internal Medicine Why: if not improving Contact information: [place] [address] [phone]         [place] EMERGENCY DEPARTMENT.   Specialty: Emergency Medicine Why: If symptoms worsen Contact information: [address] [id number] [place] [phone]               Discharge Exam: Filed Weights   07/24/21 0700 07/25/21 0500  07/26/21 0417  Weight: 65.3 kg 64.7 kg 70.8 kg      Physical Exam:   General:  AAO x 3,  cooperative, no distress;   HEENT:  Normocephalic, PERRL, otherwise with in Normal limits   Neuro:  CNII-XII intact. , normal motor and sensation, reflexes intact   Lungs:   Clear to auscultation BL, Respirations unlabored,  No wheezes / crackles  Cardio:    S1/S2, RRR, No murmure, No Rubs or Gallops   Abdomen:  Soft, non-tender, bowel sounds active all four quadrants, no guarding or peritoneal signs.  Muscular  skeletal:  Right hip surgical wound dressing in place, limited range of motion due to recent surgery and pain, Limited exam -global generalized weaknesses - in bed, able to move all 4 extremities,   2+ pulses,  symmetric, No pitting edema  Skin:  Dry, warm to touch,  negative for any Rashes,  Wounds: Please see nursing documentation  Pressure Injury 07/23/21 Perineum Right;Left Stage 2 -  Partial thickness loss of dermis presenting as a shallow open injury with a red, pink wound bed without slough. two wounds located 2cm in length and 3cm in width (Active)  07/23/21 1344  Location: Perineum  Location Orientation: Right;Left  Staging: Stage 2 -  Partial thickness loss of dermis presenting as a shallow open injury with a red, pink wound bed without slough.  Wound Description (Comments): two wounds located 2cm in length and 3cm in width  Present on Admission: Yes  Dressing Type None 07/23/21 2300          Condition at discharge: fair  The results of significant diagnostics from this hospitalization (including imaging, microbiology, ancillary and laboratory) are listed below for reference.   Imaging Studies: DG HIP UNILAT WITH PELVIS 2-3 VIEWS RIGHT  Result Date: 07/24/2021 CLINICAL DATA:  Postop fixation of right hip fracture. EXAM: DG HIP (WITH OR WITHOUT PELVIS) 2-3V RIGHT COMPARISON:  CT scan 07/23/2021 FINDINGS: There are 4 cannulated hip screws transfixing the subcapital  fracture. No complicating features are identified. IMPRESSION: Internal fixation of the subcapital fracture. Electronically Signed   By: Marijo Sanes M.D.   On: 07/24/2021 14:58   DG HIP UNILAT WITH PELVIS 2-3 VIEWS RIGHT  Result Date: 07/24/2021 CLINICAL DATA:  Right hip pinning. EXAM: DG HIP (WITH OR WITHOUT PELVIS) 2-3V RIGHT COMPARISON:  Pelvis and right hip radiographs 07/23/2021 FINDINGS: Images were performed intraoperatively without the presence of a radiologist. The patient is undergoing fixation of the previously seen nondisplaced right subcapital femoral neck fracture with 3 longitudinal screws. Total fluoroscopy images: 8 Total fluoroscopy time: 144 seconds Total dose: Radiation Exposure Index (as provided by the fluoroscopic device): 33.02 mGy air Kerma Please see intraoperative findings for further detail. IMPRESSION: Intraoperative fluoroscopy for right femoral neck ORIF. Electronically Signed   By: Yvonne Kendall M.D.   On: 07/24/2021 14:05   DG C-Arm 1-60 Min-No Report  Result Date: 07/24/2021 Fluoroscopy was utilized by the requesting physician.  No radiographic interpretation.   CT Head Wo Contrast  Result Date: 07/23/2021 CLINICAL DATA:  Mental status change EXAM: CT HEAD WITHOUT CONTRAST TECHNIQUE: Contiguous axial images were obtained from the base of the skull through the vertex without intravenous contrast. RADIATION DOSE REDUCTION: This exam was performed according to the departmental dose-optimization program which includes automated exposure control, adjustment of the mA and/or kV according to patient size and/or use of iterative reconstruction technique. COMPARISON:  CT head 06/24/2010 FINDINGS: Brain: No evidence of acute infarction, hemorrhage, hydrocephalus, extra-axial collection or mass lesion/mass effect. Ill-defined hypoattenuation within the cerebral white matter is nonspecific but consistent with chronic small vessel ischemic disease. Mild cerebral volume loss.  Chronic infarct right thalamus. Vascular: No hyperdense vessel or unexpected calcification. Skull: Normal. Negative for fracture or focal lesion. Sinuses/Orbits: Mucous retention cyst left maxillary sinus. The paranasal sinuses and mastoid air cells are otherwise well aerated. Other: None. IMPRESSION: No acute intracranial abnormality. Electronically Signed   By: Placido Sou M.D.   On: 07/23/2021 12:52   CT HIP RIGHT WO CONTRAST  Result Date: 07/23/2021 CLINICAL DATA:  Right hip pain with irregularity on radiography, for further characterization. EXAM: CT OF THE RIGHT HIP WITHOUT CONTRAST TECHNIQUE: Multidetector CT imaging of the right hip was performed according to the standard protocol. Multiplanar CT image reconstructions were also generated. RADIATION DOSE REDUCTION: This exam was performed according to the departmental  dose-optimization program which includes automated exposure control, adjustment of the mA and/or kV according to patient size and/or use of iterative reconstruction technique. COMPARISON:  Radiographs 07/23/2021 and CT pelvis 08/03/2010 FINDINGS: Bones/Joint/Cartilage Sharp irregularity in the subcapital region of the right femoral neck along the growth plate line margin strongly favors a nondisplaced subcapital fracture over degenerative spurring. Moreover, posteriorly along the right femoral neck, there is linear lucency on image 67 series 6 suspicious for a small amount of longitudinal nondisplaced fracture in the right femoral neck. Trace right hip effusion. No other acute regional fracture identified. Bridging spurring of the right sacroiliac joint anteriorly. There is evidence of spondylosis at L5-S1 only partially included on today's exam. Ligaments Suboptimally assessed by CT. Muscles and Tendons There is some localized thickening along the right edema less muscles as on image 57 series 5, cannot exclude local hematoma. 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 Soft tissues Iliac, common femoral, and superficial femoral artery atherosclerotic calcification. IMPRESSION: 1. Essentially nondisplaced subcapital right femoral neck fracture. Subtle linear component of fracture extends longitudinally along the posterior right femoral neck on image 67 series 6. Small right hip joint effusion. Possible adjacent mild hematoma along the right gemellus musculature. 2. 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 3. Atherosclerosis. Electronically Signed   By: Van Clines M.D.   On: 07/23/2021 09:44   DG Hip Unilat W or Wo Pelvis 2-3 Views Right  Result Date: 07/23/2021 CLINICAL DATA:  Pain, no known injury EXAM: DG HIP (WITH OR WITHOUT PELVIS) 3V RIGHT COMPARISON:  None Available. FINDINGS: Subcapital cortical irregularity of the right proximal femur. Mild degenerative changes of the bilateral hips and SI joints. Vascular calcifications. IMPRESSION: Subcapital cortical irregularity of the right proximal femur, likely due to nondisplaced fracture. Recommend further evaluation with cross-sectional imaging. Electronically Signed   By: Yetta Glassman M.D.   On: 07/23/2021 08:34    Microbiology: Results for orders placed or performed during the hospital encounter of 07/23/21  MRSA Next Gen by PCR, Nasal     Status: None   Collection Time: 07/24/21  9:33 AM   Specimen: Nasal Mucosa; Nasal Swab  Result Value Ref Range Status   MRSA by PCR Next Gen NOT DETECTED NOT DETECTED Final    Comment: (NOTE) The GeneXpert MRSA Assay (FDA approved for NASAL specimens only), is one component of a comprehensive MRSA colonization surveillance program. It is not intended to diagnose MRSA infection nor to guide or monitor treatment for MRSA infections. Test performance is not FDA approved in patients less than 68 years old. Performed at [facility], [address], [place], [place] [address]     Labs: CBC: Recent Labs   Lab 07/19/21 1636 07/23/21 0134 07/24/21 0504 07/25/21 0505 07/26/21 0456  WBC 9.4 13.0* 10.1 13.9* 11.2*  NEUTROABS 6.3 9.8*  --   --   --   HGB 15.4 15.8 14.2 13.7 14.5  HCT 47.0 47.6 43.2 41.9 43.9  MCV 92.7 92.2 93.1 92.1 93.6  PLT 305 281 258 244 330   Basic Metabolic Panel: Recent Labs  Lab 07/19/21 1636 07/23/21 0134 07/23/21 1309 07/24/21 0504 07/25/21 0505 07/26/21 0456  NA 144 144  --  144 138 140  K 4.1 3.4*  --  3.2* 3.5 3.7  CL 112* 109  --  109 107 108  CO2 26 28  --  $R'27 24 26  'Yk$ GLUCOSE 92 115*  --  85 92 91  BUN  18 13  --  12 9 6*  CREATININE 1.36* 1.18  --  0.81 0.77 0.75  CALCIUM 9.0 9.0  --  8.5* 8.1* 8.4*  MG  --   --  2.6*  --   --   --   PHOS  --   --  3.5  --   --   --    Liver Function Tests: No results for input(s): "AST", "ALT", "ALKPHOS", "BILITOT", "PROT", "ALBUMIN" in the last 168 hours. CBG: Recent Labs  Lab 07/24/21 0717 07/25/21 0802 07/26/21 0755  GLUCAP 96 105* 120*    Discharge time spent: greater than 30 minutes.  Signed: Deatra James, MD [facility] 07/26/2021

## 2021-07-26 NOTE — Progress Notes (Addendum)
12:20pm: CSW sent patient's clinicals to additional facilities for review.  9:20am: Patient does not have any bed offers at this time.  Madilyn Fireman, MSW, LCSW Transitions of Care  Clinical Social Worker II [phone]

## 2021-07-27 DIAGNOSIS — S72001A Fracture of unspecified part of neck of right femur, initial encounter for closed fracture: Secondary | ICD-10-CM | POA: Diagnosis not present

## 2021-07-27 LAB — BASIC METABOLIC PANEL
Anion gap: 4 — ABNORMAL LOW (ref 5–15)
BUN: 6 mg/dL — ABNORMAL LOW (ref 8–23)
CO2: 27 mmol/L (ref 22–32)
Calcium: 8.2 mg/dL — ABNORMAL LOW (ref 8.9–10.3)
Chloride: 107 mmol/L (ref 98–111)
Creatinine, Ser: 0.72 mg/dL (ref 0.61–1.24)
GFR, Estimated: >60 mL/min (ref >60)
Glucose, Bld: 98 mg/dL (ref 70–99)
Potassium: 3 mmol/L — ABNORMAL LOW (ref 3.5–5.1)
Sodium: 138 mmol/L (ref 135–145)

## 2021-07-27 LAB — GLUCOSE, CAPILLARY: Glucose-Capillary: 107 mg/dL — ABNORMAL HIGH (ref 70–99)

## 2021-07-27 LAB — CBC
HCT: 39.4 % (ref 39.0–52.0)
Hemoglobin: 13.2 g/dL (ref 13.0–17.0)
MCH: 30.4 pg (ref 26.0–34.0)
MCHC: 33.5 g/dL (ref 30.0–36.0)
MCV: 90.8 fL (ref 80.0–100.0)
Platelets: 269 10*3/uL (ref 150–400)
RBC: 4.34 MIL/uL (ref 4.22–5.81)
RDW: 14.7 % (ref 11.5–15.5)
WBC: 10.5 10*3/uL (ref 4.0–10.5)
nRBC: 0 % (ref 0.0–0.2)

## 2021-07-27 MED ORDER — METOPROLOL SUCCINATE ER 50 MG PO TB24: 50.0000 mg | ORAL_TABLET | Freq: Every day | ORAL | 0 refills | Status: DC

## 2021-07-27 MED ORDER — METOPROLOL SUCCINATE ER 50 MG PO TB24
50.0000 mg | ORAL_TABLET | Freq: Every day | ORAL | Status: DC
  Administered 2021-07-27 – 2021-07-28 (×2): 50 mg via ORAL
  Filled 2021-07-27 (×2): qty 1

## 2021-07-27 NOTE — Progress Notes (Signed)
Pt currently with no bed offers.  Aliana Kreischer Tarpley-Carter, MSW, LCSW-A Pronouns:  She/Her/Hers [facility] HealthTransitions of Care Clinical Social Worker Direct Number:  [phone] [email or web address]

## 2021-07-27 NOTE — Progress Notes (Signed)
Physician Discharge Summary   Patient: Austin [name] MRN: [id number] DOB: 1955-11-08  Admit date:     07/23/2021  Discharge date: 07/27/21  Discharge Physician: Deatra James   PCP: Carrolyn Meiers, MD   The patient was seen and examined, stable for discharge to SNF Pending insurance approval and bed availability   Recommendations at discharge:  Follow-up with a PCP and orthopedic surgery within 1 week Continue current medication, aggressive PT OT, fall precautions  Discharge Diagnoses: Principal Problem:   Hip fx, right, closed, initial encounter [facility]) Active Problems:   Essential hypertension   Hypokalemia   EtOH dependence ([place])   Pressure injury of skin  Resolved Problems:   * No resolved hospital problems. [facility] Course: Arvin KOLBIE LEPKOWSKI is a 66 year old male with history of alcohol use and abuse and multiple ED visits presenting today with generalized weaknesses and right hip pain. Patient's sister brought the patient to the ED thinking he is also dehydrated slower than normal, not ambulating much, complaining of right hip pain..  She reports that he drinks about 2-3 40's daily did not had a drink since yesterday.  No recent illnesses, unable to clarify time of falls  ED: Blood pressure (!) 140/94, pulse 82, temperature 98.4 F (36.9 C), temperature source Oral, resp. rate 16, height $RemoveBe'5\' 9"'saBfLgcmC$  (1.753 m), weight 70 kg, SpO2 97 %.  WBC 13.0, potassium 3.4, glucose 115, UA clear, EKG no acute changes, Hip chest x-ray suggested a possible fraction, recommended follow-up CT Which revealed subcapital right femoral fracture of the hip, nondisplaced  Assessment and Plan: * Hip fx, right, closed, initial encounter ([place]) -Postop day # 2 -Status post ORIF 07/24/2021 by Dr. Aline Brochure  -Tolerated procedure well, stable  -Hip chest x-ray suggested a possible fraction, recommended follow-up CT Which revealed subcapital right femoral fracture of the hip,  nondisplaced  -Orthopedic, Dr. Aline Brochure -following  -As needed analgesics  Essential hypertension BP still mildly elevated -Apparently he was supposed to take Norvasc 5 (may not have been taking it) -Restarting Norvasc at 10 mg p.o. daily -Added metoprolol 25 mg p.o. daily today 07/25/2021 -Monitoring closely, as needed hydralazine   Hypokalemia -Potassium 3.4, 3.5  - Monitoring, repleted  EtOH dependence ([place]) -Monitoring closely, no signs of withdrawal at this point With exception of brief tachycardia overnight -CIWA protocol initiated  -Continue thiamine, folate supplements        Pain control - [place] Controlled Substance Reporting System database was reviewed. and patient was instructed, not to drive, operate heavy machinery, perform activities at heights, swimming or participation in water activities or provide baby-sitting services while on Pain, Sleep and Anxiety Medications; until their outpatient Physician has advised to do so again. Also recommended to not to take more than prescribed Pain, Sleep and Anxiety Medications.  Consultants: Orthopedic surgery Procedures performed: ORIF of right hip Disposition: Skilled nursing facility Diet recommendation:  Discharge Diet Orders (From admission, onward)     Start     Ordered   07/26/21 0000  Diet - low sodium heart healthy        07/26/21 1148           Regular diet DISCHARGE MEDICATION: Allergies as of 07/27/2021       Reactions   Fish Allergy Anaphylaxis        Medication List     TAKE these medications    acetaminophen 325 MG tablet Commonly known as: TYLENOL Take 2 tablets (650 mg total) by mouth every  6 (six) hours as needed for mild pain (or Fever >/= 101).   amLODipine 10 MG tablet Commonly known as: NORVASC Take 1 tablet (10 mg total) by mouth daily. What changed:  medication strength how much to take   aspirin EC 81 MG tablet Take 1 tablet (81 mg total) by mouth daily.  Swallow whole.   docusate sodium 100 MG capsule Commonly known as: COLACE Take 1 capsule (100 mg total) by mouth 2 (two) times daily.   folic acid 1 MG tablet Commonly known as: FOLVITE Take 1 tablet (1 mg total) by mouth daily for 5 days.   metoprolol succinate 50 MG 24 hr tablet Commonly known as: TOPROL-XL Take 1 tablet (50 mg total) by mouth daily. Take with or immediately following a meal.   Na Sulfate-K Sulfate-Mg Sulf 17.5-3.13-1.6 GM/177ML Soln Commonly known as: Suprep Bowel Prep Kit Take 1 kit by mouth as directed.   nicotine 14 mg/24hr patch Commonly known as: NICODERM CQ - dosed in mg/24 hours Place 1 patch (14 mg total) onto the skin daily.   ondansetron 4 MG tablet Commonly known as: ZOFRAN Take 1 tablet (4 mg total) by mouth every 6 (six) hours as needed for nausea.   senna-docusate 8.6-50 MG tablet Commonly known as: Senokot-S Take 1 tablet by mouth at bedtime as needed for up to 10 days for mild constipation.   thiamine 100 MG tablet Take 1 tablet (100 mg total) by mouth daily for 5 days.   traMADol 50 MG tablet Commonly known as: ULTRAM Take 1 tablet (50 mg total) by mouth every 6 (six) hours for 5 days.   traZODone 50 MG tablet Commonly known as: DESYREL Take 0.5 tablets (25 mg total) by mouth at bedtime as needed for up to 20 days for sleep.               Discharge Care Instructions  (From admission, onward)           Start     Ordered   07/26/21 0000  Discharge wound care:       Comments: Per surgery/orthopedic surgery recommendations   07/26/21 1148            Follow-up Information     Schedule an appointment as soon as possible for a visit  with Carrolyn Meiers, MD.   Specialty: Internal Medicine Why: if not improving Contact information: [place] [address] [phone]         [facility].   Specialty: Emergency Medicine Why: If symptoms worsen Contact  information: [address] [id number] [place] [phone]               Discharge Exam: Filed Weights   07/25/21 0500 07/26/21 0417 07/27/21 0510  Weight: 64.7 kg 70.8 kg 68.5 kg       Physical Exam:   General:  AAO x 3,  cooperative, no distress;   HEENT:  Normocephalic, PERRL, otherwise with in Normal limits   Neuro:  CNII-XII intact. , normal motor and sensation, reflexes intact   Lungs:   Clear to auscultation BL, Respirations unlabored,  No wheezes / crackles  Cardio:    S1/S2, RRR, No murmure, No Rubs or Gallops   Abdomen:  Soft, non-tender, bowel sounds active all four quadrants, no guarding or peritoneal signs.  Muscular  skeletal:  Right hip pain -range of motion limited at right hip  Limited exam -global generalized weaknesses - in  bed, able to move all 4 extremities,   2+ pulses,  symmetric, No pitting edema  Skin:  Dry, warm to touch, negative for any Rashes, right hip surgical wound dressing in place mild tenderness negative erythema edema  Wounds: Please see nursing documentation  Pressure Injury 07/23/21 Perineum Right;Left Stage 2 -  Partial thickness loss of dermis presenting as a shallow open injury with a red, pink wound bed without slough. two wounds located 2cm in length and 3cm in width (Active)  07/23/21 1344  Location: Perineum  Location Orientation: Right;Left  Staging: Stage 2 -  Partial thickness loss of dermis presenting as a shallow open injury with a red, pink wound bed without slough.  Wound Description (Comments): two wounds located 2cm in length and 3cm in width  Present on Admission: Yes  Dressing Type None 07/26/21 2042             Condition at discharge: fair  The results of significant diagnostics from this hospitalization (including imaging, microbiology, ancillary and laboratory) are listed below for reference.   Imaging Studies: DG HIP UNILAT WITH PELVIS 2-3 VIEWS RIGHT  Result  Date: 07/24/2021 CLINICAL DATA:  Postop fixation of right hip fracture. EXAM: DG HIP (WITH OR WITHOUT PELVIS) 2-3V RIGHT COMPARISON:  CT scan 07/23/2021 FINDINGS: There are 4 cannulated hip screws transfixing the subcapital fracture. No complicating features are identified. IMPRESSION: Internal fixation of the subcapital fracture. Electronically Signed   By: Marijo Sanes M.D.   On: 07/24/2021 14:58   DG HIP UNILAT WITH PELVIS 2-3 VIEWS RIGHT  Result Date: 07/24/2021 CLINICAL DATA:  Right hip pinning. EXAM: DG HIP (WITH OR WITHOUT PELVIS) 2-3V RIGHT COMPARISON:  Pelvis and right hip radiographs 07/23/2021 FINDINGS: Images were performed intraoperatively without the presence of a radiologist. The patient is undergoing fixation of the previously seen nondisplaced right subcapital femoral neck fracture with 3 longitudinal screws. Total fluoroscopy images: 8 Total fluoroscopy time: 144 seconds Total dose: Radiation Exposure Index (as provided by the fluoroscopic device): 33.02 mGy air Kerma Please see intraoperative findings for further detail. IMPRESSION: Intraoperative fluoroscopy for right femoral neck ORIF. Electronically Signed   By: Yvonne Kendall M.D.   On: 07/24/2021 14:05   DG C-Arm 1-60 Min-No Report  Result Date: 07/24/2021 Fluoroscopy was utilized by the requesting physician.  No radiographic interpretation.   CT Head Wo Contrast  Result Date: 07/23/2021 CLINICAL DATA:  Mental status change EXAM: CT HEAD WITHOUT CONTRAST TECHNIQUE: Contiguous axial images were obtained from the base of the skull through the vertex without intravenous contrast. RADIATION DOSE REDUCTION: This exam was performed according to the departmental dose-optimization program which includes automated exposure control, adjustment of the mA and/or kV according to patient size and/or use of iterative reconstruction technique. COMPARISON:  CT head 06/24/2010 FINDINGS: Brain: No evidence of acute infarction, hemorrhage,  hydrocephalus, extra-axial collection or mass lesion/mass effect. Ill-defined hypoattenuation within the cerebral white matter is nonspecific but consistent with chronic small vessel ischemic disease. Mild cerebral volume loss. Chronic infarct right thalamus. Vascular: No hyperdense vessel or unexpected calcification. Skull: Normal. Negative for fracture or focal lesion. Sinuses/Orbits: Mucous retention cyst left maxillary sinus. The paranasal sinuses and mastoid air cells are otherwise well aerated. Other: None. IMPRESSION: No acute intracranial abnormality. Electronically Signed   By: Placido Sou M.D.   On: 07/23/2021 12:52   CT HIP RIGHT WO CONTRAST  Result Date: 07/23/2021 CLINICAL DATA:  Right hip pain with irregularity on radiography, for further characterization. EXAM: CT OF  THE RIGHT HIP WITHOUT CONTRAST TECHNIQUE: Multidetector CT imaging of the right hip was performed according to the standard protocol. Multiplanar CT image reconstructions were also generated. RADIATION DOSE REDUCTION: This exam was performed according to the departmental dose-optimization program which includes automated exposure control, adjustment of the mA and/or kV according to patient size and/or use of iterative reconstruction technique. COMPARISON:  Radiographs 07/23/2021 and CT pelvis 08/03/2010 FINDINGS: Bones/Joint/Cartilage Sharp irregularity in the subcapital region of the right femoral neck along the growth plate line margin strongly favors a nondisplaced subcapital fracture over degenerative spurring. Moreover, posteriorly along the right femoral neck, there is linear lucency on image 67 series 6 suspicious for a small amount of longitudinal nondisplaced fracture in the right femoral neck. Trace right hip effusion. No other acute regional fracture identified. Bridging spurring of the right sacroiliac joint anteriorly. There is evidence of spondylosis at L5-S1 only partially included on today's exam. Ligaments  Suboptimally assessed by CT. Muscles and Tendons There is some localized thickening along the right edema less muscles as on image 57 series 5, cannot exclude local hematoma. 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 Soft tissues Iliac, common femoral, and superficial femoral artery atherosclerotic calcification. IMPRESSION: 1. Essentially nondisplaced subcapital right femoral neck fracture. Subtle linear component of fracture extends longitudinally along the posterior right femoral neck on image 67 series 6. Small right hip joint effusion. Possible adjacent mild hematoma along the right gemellus musculature. 2. 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 3. Atherosclerosis. Electronically Signed   By: Van Clines M.D.   On: 07/23/2021 09:44   DG Hip Unilat W or Wo Pelvis 2-3 Views Right  Result Date: 07/23/2021 CLINICAL DATA:  Pain, no known injury EXAM: DG HIP (WITH OR WITHOUT PELVIS) 3V RIGHT COMPARISON:  None Available. FINDINGS: Subcapital cortical irregularity of the right proximal femur. Mild degenerative changes of the bilateral hips and SI joints. Vascular calcifications. IMPRESSION: Subcapital cortical irregularity of the right proximal femur, likely due to nondisplaced fracture. Recommend further evaluation with cross-sectional imaging. Electronically Signed   By: Yetta Glassman M.D.   On: 07/23/2021 08:34    Microbiology: Results for orders placed or performed during the hospital encounter of 07/23/21  MRSA Next Gen by PCR, Nasal     Status: None   Collection Time: 07/24/21  9:33 AM   Specimen: Nasal Mucosa; Nasal Swab  Result Value Ref Range Status   MRSA by PCR Next Gen NOT DETECTED NOT DETECTED Final    Comment: (NOTE) The GeneXpert MRSA Assay (FDA approved for NASAL specimens only), is one component of a comprehensive MRSA colonization surveillance program. It is not intended to diagnose MRSA infection nor  to guide or monitor treatment for MRSA infections. Test performance is not FDA approved in patients less than 25 years old. Performed at [facility] [address] [place], [place] [address]     Labs: CBC: Recent Labs  Lab 07/23/21 0134 07/24/21 0504 07/25/21 0505 07/26/21 0456 07/27/21 0551  WBC 13.0* 10.1 13.9* 11.2* 10.5  NEUTROABS 9.8*  --   --   --   --   HGB 15.8 14.2 13.7 14.5 13.2  HCT 47.6 43.2 41.9 43.9 39.4  MCV 92.2 93.1 92.1 93.6 90.8  PLT 281 258 244 236 412   Basic Metabolic Panel: Recent Labs  Lab 07/23/21 0134 07/23/21 1309 07/24/21 0504 07/25/21 0505 07/26/21 0456 07/27/21 0551  NA 144  --  144 138 140 138  K  3.4*  --  3.2* 3.5 3.7 3.0*  CL 109  --  109 107 108 107  CO2 28  --  $R'27 24 26 27  'va$ GLUCOSE 115*  --  85 92 91 98  BUN 13  --  12 9 6* 6*  CREATININE 1.18  --  0.81 0.77 0.75 0.72  CALCIUM 9.0  --  8.5* 8.1* 8.4* 8.2*  MG  --  2.6*  --   --   --   --   PHOS  --  3.5  --   --   --   --    Liver Function Tests: No results for input(s): "AST", "ALT", "ALKPHOS", "BILITOT", "PROT", "ALBUMIN" in the last 168 hours. CBG: Recent Labs  Lab 07/24/21 0717 07/25/21 0802 07/26/21 0755 07/27/21 0740  GLUCAP 96 105* 120* 107*    Discharge time spent: greater than 30 minutes.  Signed: Deatra James, MD [facility] 07/27/2021

## 2021-07-27 NOTE — Progress Notes (Signed)
Attempted to place a pink foam to patient buttocks. Patient refused and stated, "I dont need all that". Patient resting comfortably with call bell within reach and bed alarm on.

## 2021-07-28 DIAGNOSIS — S72001A Fracture of unspecified part of neck of right femur, initial encounter for closed fracture: Secondary | ICD-10-CM | POA: Diagnosis not present

## 2021-07-28 LAB — BASIC METABOLIC PANEL
Anion gap: 5 (ref 5–15)
BUN: 7 mg/dL — ABNORMAL LOW (ref 8–23)
CO2: 31 mmol/L (ref 22–32)
Calcium: 8.3 mg/dL — ABNORMAL LOW (ref 8.9–10.3)
Chloride: 103 mmol/L (ref 98–111)
Creatinine, Ser: 0.84 mg/dL (ref 0.61–1.24)
GFR, Estimated: >60 mL/min (ref >60)
Glucose, Bld: 95 mg/dL (ref 70–99)
Potassium: 3.1 mmol/L — ABNORMAL LOW (ref 3.5–5.1)
Sodium: 139 mmol/L (ref 135–145)

## 2021-07-28 LAB — CBC
HCT: 38.3 % — ABNORMAL LOW (ref 39.0–52.0)
Hemoglobin: 12.9 g/dL — ABNORMAL LOW (ref 13.0–17.0)
MCH: 30.8 pg (ref 26.0–34.0)
MCHC: 33.7 g/dL (ref 30.0–36.0)
MCV: 91.4 fL (ref 80.0–100.0)
Platelets: 292 10*3/uL (ref 150–400)
RBC: 4.19 MIL/uL — ABNORMAL LOW (ref 4.22–5.81)
RDW: 14.7 % (ref 11.5–15.5)
WBC: 9.5 10*3/uL (ref 4.0–10.5)
nRBC: 0 % (ref 0.0–0.2)

## 2021-07-28 LAB — GLUCOSE, CAPILLARY: Glucose-Capillary: 120 mg/dL — ABNORMAL HIGH (ref 70–99)

## 2021-07-28 NOTE — Discharge Summary (Signed)
Physician Discharge Summary   Patient: Austin [name] MRN: [id number] DOB: 11/25/1955  Admit date:     07/23/2021  Discharge date: 07/28/21  Discharge Physician: Deatra James   PCP: Carrolyn Meiers, MD   The patient was seen and examined, stable for discharge to SNF Pending insurance approval and bed availability   Recommendations at discharge:  Follow-up with a PCP and orthopedic surgery within 1 week Continue current medication, aggressive PT OT, fall precautions  Discharge Diagnoses: Principal Problem:   Hip fx, right, closed, initial encounter [facility]) Active Problems:   Essential hypertension   Hypokalemia   EtOH dependence ([place])   Pressure injury of skin  Resolved Problems:   * No resolved hospital problems. [facility] Course: Shankar ADRYAN SHIN is a 66 year old male with history of alcohol use and abuse and multiple ED visits presenting today with generalized weaknesses and right hip pain. Patient's sister brought the patient to the ED thinking he is also dehydrated slower than normal, not ambulating much, complaining of right hip pain..  She reports that he drinks about 2-3 40's daily did not had a drink since yesterday.  No recent illnesses, unable to clarify time of falls  ED: Blood pressure (!) 140/94, pulse 82, temperature 98.4 F (36.9 C), temperature source Oral, resp. rate 16, height $RemoveBe'5\' 9"'JpSIeTeyX$  (1.753 m), weight 70 kg, SpO2 97 %.  WBC 13.0, potassium 3.4, glucose 115, UA clear, EKG no acute changes, Hip chest x-ray suggested a possible fraction, recommended follow-up CT Which revealed subcapital right femoral fracture of the hip, nondisplaced  Assessment and Plan: * Hip fx, right, closed, initial encounter ([place]) -Postop day # 2 -Status post ORIF 07/24/2021 by Dr. Aline Brochure  -Tolerated procedure well, stable  -Hip chest x-ray suggested a possible fraction, recommended follow-up CT Which revealed subcapital right femoral fracture of the hip,  nondisplaced  -Orthopedic, Dr. Aline Brochure -following  -As needed analgesics  Essential hypertension BP still mildly elevated -Apparently he was supposed to take Norvasc 5 (may not have been taking it) -Restarting Norvasc at 10 mg p.o. daily -Added metoprolol 25 mg p.o. daily today 07/25/2021 -Monitoring closely, as needed hydralazine   Hypokalemia -Potassium 3.4, 3.5  - Monitoring, repleted  EtOH dependence ([place]) -Monitoring closely, no signs of withdrawal at this point With exception of brief tachycardia overnight -CIWA protocol initiated  -Continue thiamine, folate supplements        Pain control - [place] Controlled Substance Reporting System database was reviewed. and patient was instructed, not to drive, operate heavy machinery, perform activities at heights, swimming or participation in water activities or provide baby-sitting services while on Pain, Sleep and Anxiety Medications; until their outpatient Physician has advised to do so again. Also recommended to not to take more than prescribed Pain, Sleep and Anxiety Medications.  Consultants: Orthopedic surgery Procedures performed: ORIF of right hip Disposition: Skilled nursing facility Diet recommendation:  Discharge Diet Orders (From admission, onward)     Start     Ordered   07/26/21 0000  Diet - low sodium heart healthy        07/26/21 1148           Regular diet DISCHARGE MEDICATION: Allergies as of 07/28/2021       Reactions   Fish Allergy Anaphylaxis        Medication List     TAKE these medications    acetaminophen 325 MG tablet Commonly known as: TYLENOL Take 2 tablets (650 mg total) by mouth every  6 (six) hours as needed for mild pain (or Fever >/= 101).   amLODipine 10 MG tablet Commonly known as: NORVASC Take 1 tablet (10 mg total) by mouth daily. What changed:  medication strength how much to take   aspirin EC 81 MG tablet Take 1 tablet (81 mg total) by mouth daily.  Swallow whole.   docusate sodium 100 MG capsule Commonly known as: COLACE Take 1 capsule (100 mg total) by mouth 2 (two) times daily.   folic acid 1 MG tablet Commonly known as: FOLVITE Take 1 tablet (1 mg total) by mouth daily for 5 days.   metoprolol succinate 50 MG 24 hr tablet Commonly known as: TOPROL-XL Take 1 tablet (50 mg total) by mouth daily. Take with or immediately following a meal.   Na Sulfate-K Sulfate-Mg Sulf 17.5-3.13-1.6 GM/177ML Soln Commonly known as: Suprep Bowel Prep Kit Take 1 kit by mouth as directed.   nicotine 14 mg/24hr patch Commonly known as: NICODERM CQ - dosed in mg/24 hours Place 1 patch (14 mg total) onto the skin daily.   ondansetron 4 MG tablet Commonly known as: ZOFRAN Take 1 tablet (4 mg total) by mouth every 6 (six) hours as needed for nausea.   senna-docusate 8.6-50 MG tablet Commonly known as: Senokot-S Take 1 tablet by mouth at bedtime as needed for up to 10 days for mild constipation.   thiamine 100 MG tablet Take 1 tablet (100 mg total) by mouth daily for 5 days.   traMADol 50 MG tablet Commonly known as: ULTRAM Take 1 tablet (50 mg total) by mouth every 6 (six) hours for 5 days.   traZODone 50 MG tablet Commonly known as: DESYREL Take 0.5 tablets (25 mg total) by mouth at bedtime as needed for up to 20 days for sleep.               Discharge Care Instructions  (From admission, onward)           Start     Ordered   07/26/21 0000  Discharge wound care:       Comments: Per surgery/orthopedic surgery recommendations   07/26/21 1148            Follow-up Information     Schedule an appointment as soon as possible for a visit  with Carrolyn Meiers, MD.   Specialty: Internal Medicine Why: if not improving Contact information: [place] [address] [phone]         [facility].   Specialty: Emergency Medicine Why: If symptoms worsen Contact  information: [address] [id number] [place] [phone]               Discharge Exam: Filed Weights   07/26/21 0417 07/27/21 0510 07/28/21 0500  Weight: 70.8 kg 68.5 kg 65 kg         Physical Exam:   General:  AAO x 3,  cooperative, no distress;   HEENT:  Normocephalic, PERRL, otherwise with in Normal limits   Neuro:  CNII-XII intact. , normal motor and sensation, reflexes intact   Lungs:   Clear to auscultation BL, Respirations unlabored,  No wheezes / crackles  Cardio:    S1/S2, RRR, No murmure, No Rubs or Gallops   Abdomen:  Soft, non-tender, bowel sounds active all four quadrants, no guarding or peritoneal signs.  Muscular  skeletal:  Limited exam -global generalized weaknesses - in bed, able to move all 4 extremities,  2+ pulses,  symmetric, No pitting edema  Skin:  Dry, warm to touch, negative for any Rashes,  Wounds: Please see nursing documentation  Pressure Injury 07/23/21 Perineum Right;Left Stage 2 -  Partial thickness loss of dermis presenting as a shallow open injury with a red, pink wound bed without slough. two wounds located 2cm in length and 3cm in width (Active)  07/23/21 1344  Location: Perineum  Location Orientation: Right;Left  Staging: Stage 2 -  Partial thickness loss of dermis presenting as a shallow open injury with a red, pink wound bed without slough.  Wound Description (Comments): two wounds located 2cm in length and 3cm in width  Present on Admission: Yes  Dressing Type Foam - Lift dressing to assess site every shift 07/27/21 2033             Condition at discharge: fair  The results of significant diagnostics from this hospitalization (including imaging, microbiology, ancillary and laboratory) are listed below for reference.   Imaging Studies: DG HIP UNILAT WITH PELVIS 2-3 VIEWS RIGHT  Result Date: 07/24/2021 CLINICAL DATA:  Postop fixation of right hip fracture. EXAM: DG HIP (WITH OR  WITHOUT PELVIS) 2-3V RIGHT COMPARISON:  CT scan 07/23/2021 FINDINGS: There are 4 cannulated hip screws transfixing the subcapital fracture. No complicating features are identified. IMPRESSION: Internal fixation of the subcapital fracture. Electronically Signed   By: Marijo Sanes M.D.   On: 07/24/2021 14:58   DG HIP UNILAT WITH PELVIS 2-3 VIEWS RIGHT  Result Date: 07/24/2021 CLINICAL DATA:  Right hip pinning. EXAM: DG HIP (WITH OR WITHOUT PELVIS) 2-3V RIGHT COMPARISON:  Pelvis and right hip radiographs 07/23/2021 FINDINGS: Images were performed intraoperatively without the presence of a radiologist. The patient is undergoing fixation of the previously seen nondisplaced right subcapital femoral neck fracture with 3 longitudinal screws. Total fluoroscopy images: 8 Total fluoroscopy time: 144 seconds Total dose: Radiation Exposure Index (as provided by the fluoroscopic device): 33.02 mGy air Kerma Please see intraoperative findings for further detail. IMPRESSION: Intraoperative fluoroscopy for right femoral neck ORIF. Electronically Signed   By: Yvonne Kendall M.D.   On: 07/24/2021 14:05   DG C-Arm 1-60 Min-No Report  Result Date: 07/24/2021 Fluoroscopy was utilized by the requesting physician.  No radiographic interpretation.   CT Head Wo Contrast  Result Date: 07/23/2021 CLINICAL DATA:  Mental status change EXAM: CT HEAD WITHOUT CONTRAST TECHNIQUE: Contiguous axial images were obtained from the base of the skull through the vertex without intravenous contrast. RADIATION DOSE REDUCTION: This exam was performed according to the departmental dose-optimization program which includes automated exposure control, adjustment of the mA and/or kV according to patient size and/or use of iterative reconstruction technique. COMPARISON:  CT head 06/24/2010 FINDINGS: Brain: No evidence of acute infarction, hemorrhage, hydrocephalus, extra-axial collection or mass lesion/mass effect. Ill-defined hypoattenuation within the  cerebral white matter is nonspecific but consistent with chronic small vessel ischemic disease. Mild cerebral volume loss. Chronic infarct right thalamus. Vascular: No hyperdense vessel or unexpected calcification. Skull: Normal. Negative for fracture or focal lesion. Sinuses/Orbits: Mucous retention cyst left maxillary sinus. The paranasal sinuses and mastoid air cells are otherwise well aerated. Other: None. IMPRESSION: No acute intracranial abnormality. Electronically Signed   By: Placido Sou M.D.   On: 07/23/2021 12:52   CT HIP RIGHT WO CONTRAST  Result Date: 07/23/2021 CLINICAL DATA:  Right hip pain with irregularity on radiography, for further characterization. EXAM: CT OF THE RIGHT HIP WITHOUT CONTRAST TECHNIQUE: Multidetector CT imaging of the right hip  was performed according to the standard protocol. Multiplanar CT image reconstructions were also generated. RADIATION DOSE REDUCTION: This exam was performed according to the departmental dose-optimization program which includes automated exposure control, adjustment of the mA and/or kV according to patient size and/or use of iterative reconstruction technique. COMPARISON:  Radiographs 07/23/2021 and CT pelvis 08/03/2010 FINDINGS: Bones/Joint/Cartilage Sharp irregularity in the subcapital region of the right femoral neck along the growth plate line margin strongly favors a nondisplaced subcapital fracture over degenerative spurring. Moreover, posteriorly along the right femoral neck, there is linear lucency on image 67 series 6 suspicious for a small amount of longitudinal nondisplaced fracture in the right femoral neck. Trace right hip effusion. No other acute regional fracture identified. Bridging spurring of the right sacroiliac joint anteriorly. There is evidence of spondylosis at L5-S1 only partially included on today's exam. Ligaments Suboptimally assessed by CT. Muscles and Tendons There is some localized thickening along the right edema less  muscles as on image 57 series 5, cannot exclude local hematoma. 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 Soft tissues Iliac, common femoral, and superficial femoral artery atherosclerotic calcification. IMPRESSION: 1. Essentially nondisplaced subcapital right femoral neck fracture. Subtle linear component of fracture extends longitudinally along the posterior right femoral neck on image 67 series 6. Small right hip joint effusion. Possible adjacent mild hematoma along the right gemellus musculature. 2. 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 3. Atherosclerosis. Electronically Signed   By: Van Clines M.D.   On: 07/23/2021 09:44   DG Hip Unilat W or Wo Pelvis 2-3 Views Right  Result Date: 07/23/2021 CLINICAL DATA:  Pain, no known injury EXAM: DG HIP (WITH OR WITHOUT PELVIS) 3V RIGHT COMPARISON:  None Available. FINDINGS: Subcapital cortical irregularity of the right proximal femur. Mild degenerative changes of the bilateral hips and SI joints. Vascular calcifications. IMPRESSION: Subcapital cortical irregularity of the right proximal femur, likely due to nondisplaced fracture. Recommend further evaluation with cross-sectional imaging. Electronically Signed   By: Yetta Glassman M.D.   On: 07/23/2021 08:34    Microbiology: Results for orders placed or performed during the hospital encounter of 07/23/21  MRSA Next Gen by PCR, Nasal     Status: None   Collection Time: 07/24/21  9:33 AM   Specimen: Nasal Mucosa; Nasal Swab  Result Value Ref Range Status   MRSA by PCR Next Gen NOT DETECTED NOT DETECTED Final    Comment: (NOTE) The GeneXpert MRSA Assay (FDA approved for NASAL specimens only), is one component of a comprehensive MRSA colonization surveillance program. It is not intended to diagnose MRSA infection nor to guide or monitor treatment for MRSA infections. Test performance is not FDA approved in patients less than  66 years old. Performed at [facility], [address] [place], [place] [address]     Labs: CBC: Recent Labs  Lab 07/23/21 0134 07/24/21 0504 07/25/21 0505 07/26/21 0456 07/27/21 0551 07/28/21 0542  WBC 13.0* 10.1 13.9* 11.2* 10.5 9.5  NEUTROABS 9.8*  --   --   --   --   --   HGB 15.8 14.2 13.7 14.5 13.2 12.9*  HCT 47.6 43.2 41.9 43.9 39.4 38.3*  MCV 92.2 93.1 92.1 93.6 90.8 91.4  PLT 281 258 244 236 269 981   Basic Metabolic Panel: Recent Labs  Lab 07/23/21 1309 07/24/21 0504 07/25/21 0505 07/26/21 0456 07/27/21 0551 07/28/21 0542  NA  --  144 138 140 138 139  K  --  3.2* 3.5 3.7 3.0* 3.1*  CL  --  109 107 108 107 103  CO2  --  $R'27 24 26 27 31  'Fm$ GLUCOSE  --  85 92 91 98 95  BUN  --  12 9 6* 6* 7*  CREATININE  --  0.81 0.77 0.75 0.72 0.84  CALCIUM  --  8.5* 8.1* 8.4* 8.2* 8.3*  MG 2.6*  --   --   --   --   --   PHOS 3.5  --   --   --   --   --    Liver Function Tests: No results for input(s): "AST", "ALT", "ALKPHOS", "BILITOT", "PROT", "ALBUMIN" in the last 168 hours. CBG: Recent Labs  Lab 07/24/21 0717 07/25/21 0802 07/26/21 0755 07/27/21 0740 07/28/21 0658  GLUCAP 96 105* 120* 107* 120*    Discharge time spent: greater than 40 minutes.  Signed: Deatra James, MD [facility] 07/28/2021

## 2021-07-28 NOTE — TOC Transition Note (Signed)
Transition of Care [facility]) - CM/SW Discharge Note   Patient Details  Name: PLATO ALSPAUGH MRN: [id number] Date of Birth: 1955-12-23  Transition of Care [facility]) CM/SW Contact:  Ihor Gully, LCSW Phone Number: 07/28/2021, 10:50 AM   Clinical Narrative:    Discharge clinicals sent to facility. [facility] to transport. TOC signing off.    Final next level of care: Skilled Nursing Facility Barriers to Discharge: No Barriers Identified   Patient Goals and CMS Choice Patient states their goals for this hospitalization and ongoing recovery are:: rehab then home   Choice offered to / list presented to : Adult Children  Discharge Placement              Patient [facility] Patient to be transferred to facility by: [place] Name of family member notified: Jonelle Sidle, daughter Patient and family notified of of transfer: 07/28/21  Discharge Plan and Services     Post Acute Care Choice: [place]                               Social Determinants of Health (SDOH) Interventions     Readmission Risk Interventions     No data to display

## 2021-07-28 NOTE — Care Management Important Message (Signed)
Important Message  Patient Details  Name: Austin [name] MRN: [id number] Date of Birth: 1956-01-02   Medicare Important Message Given:  Yes     Tommy Medal 07/28/2021, 10:25 AM

## 2021-07-28 NOTE — Progress Notes (Signed)
Report given to [facility] LPN at [facility] and rehab

## 2021-07-28 NOTE — Progress Notes (Signed)
Nsg Discharge Note  Admit Date:  07/23/2021 Discharge date: 07/28/2021   Spero Geralds Scaduto to be D/C'd [facility] and rehab  per MD order.  AVS completed.  Copy for chart, and copy for patient signed, and dated. Patient/caregiver able to verbalize understanding.  Discharge Medication: Allergies as of 07/28/2021       Reactions   Fish Allergy Anaphylaxis        Medication List     TAKE these medications    acetaminophen 325 MG tablet Commonly known as: TYLENOL Take 2 tablets (650 mg total) by mouth every 6 (six) hours as needed for mild pain (or Fever >/= 101).   amLODipine 10 MG tablet Commonly known as: NORVASC Take 1 tablet (10 mg total) by mouth daily. What changed:  medication strength how much to take   aspirin EC 81 MG tablet Take 1 tablet (81 mg total) by mouth daily. Swallow whole.   docusate sodium 100 MG capsule Commonly known as: COLACE Take 1 capsule (100 mg total) by mouth 2 (two) times daily.   folic acid 1 MG tablet Commonly known as: FOLVITE Take 1 tablet (1 mg total) by mouth daily for 5 days.   metoprolol succinate 50 MG 24 hr tablet Commonly known as: TOPROL-XL Take 1 tablet (50 mg total) by mouth daily. Take with or immediately following a meal.   Na Sulfate-K Sulfate-Mg Sulf 17.5-3.13-1.6 GM/177ML Soln Commonly known as: Suprep Bowel Prep Kit Take 1 kit by mouth as directed.   nicotine 14 mg/24hr patch Commonly known as: NICODERM CQ - dosed in mg/24 hours Place 1 patch (14 mg total) onto the skin daily.   ondansetron 4 MG tablet Commonly known as: ZOFRAN Take 1 tablet (4 mg total) by mouth every 6 (six) hours as needed for nausea.   senna-docusate 8.6-50 MG tablet Commonly known as: Senokot-S Take 1 tablet by mouth at bedtime as needed for up to 10 days for mild constipation.   thiamine 100 MG tablet Take 1 tablet (100 mg total) by mouth daily for 5 days.   traMADol 50 MG tablet Commonly known as: ULTRAM Take 1 tablet (50 mg  total) by mouth every 6 (six) hours for 5 days.   traZODone 50 MG tablet Commonly known as: DESYREL Take 0.5 tablets (25 mg total) by mouth at bedtime as needed for up to 20 days for sleep.               Discharge Care Instructions  (From admission, onward)           Start     Ordered   07/26/21 0000  Discharge wound care:       Comments: Per surgery/orthopedic surgery recommendations   07/26/21 1148            Discharge Assessment: Vitals:   07/27/21 2129 07/28/21 0405  BP: (!) 157/86 (!) 142/80  Pulse: (!) 52 (!) 57  Resp: 20 18  Temp: 98.3 F (36.8 C) 97.8 F (36.6 C)  SpO2: 100% 97%   Skin clean, dry and intact without evidence of skin break down, no evidence of skin tears noted. IV catheter discontinued intact. Site without signs and symptoms of complications - no redness or edema noted at insertion site, patient denies c/o pain - only slight tenderness at site.  Dressing with slight pressure applied.  D/c Instructions-Education: Discharge instructions given to patient/family with verbalized understanding. D/c education completed with patient/family including follow up instructions, medication list, d/c activities limitations if  indicated, with other d/c instructions as indicated by MD - patient able to verbalize understanding, all questions fully answered. Patient instructed to return to ED, call 911, or call MD for any changes in condition.  Patient escorted via [place], and D/C home via private auto.  Clovis Fredrickson, LPN 01/08/1279 18:86 AM

## 2021-07-29 ENCOUNTER — Encounter (HOSPITAL_COMMUNITY): Payer: Self-pay | Admitting: Orthopedic Surgery

## 2021-08-25 ENCOUNTER — Ambulatory Visit: Payer: Medicare Other | Admitting: Internal Medicine

## 2021-08-25 NOTE — Progress Notes (Deleted)
New Patient Office Visit  Subjective    Patient ID: SHAYON TROMPETER, male    DOB: Mar 03, 1955  Age: 66 y.o. MRN: [id number]  CC: No chief complaint on file.   HPI Jordon K Merle presents to establish care ***  Recent hip fx HTN EtOH abuse   Outpatient Encounter Medications as of 08/25/2021  Medication Sig   acetaminophen (TYLENOL) 325 MG tablet Take 2 tablets (650 mg total) by mouth every 6 (six) hours as needed for mild pain (or Fever >/= 101).   amLODipine (NORVASC) 10 MG tablet Take 1 tablet (10 mg total) by mouth daily.   aspirin EC 81 MG tablet Take 1 tablet (81 mg total) by mouth daily. Swallow whole.   docusate sodium (COLACE) 100 MG capsule Take 1 capsule (100 mg total) by mouth 2 (two) times daily.   metoprolol succinate (TOPROL-XL) 50 MG 24 hr tablet Take 1 tablet (50 mg total) by mouth daily. Take with or immediately following a meal.   Na Sulfate-K Sulfate-Mg Sulf (SUPREP BOWEL PREP KIT) 17.5-3.13-1.6 GM/177ML SOLN Take 1 kit by mouth as directed. (Patient not taking: Reported on 07/23/2021)   nicotine (NICODERM CQ - DOSED IN MG/24 HOURS) 14 mg/24hr patch Place 1 patch (14 mg total) onto the skin daily.   ondansetron (ZOFRAN) 4 MG tablet Take 1 tablet (4 mg total) by mouth every 6 (six) hours as needed for nausea.   traZODone (DESYREL) 50 MG tablet Take 0.5 tablets (25 mg total) by mouth at bedtime as needed for up to 20 days for sleep.   No facility-administered encounter medications on file as of 08/25/2021.    Past Medical History:  Diagnosis Date   Dyspnea     Past Surgical History:  Procedure Laterality Date   COLONOSCOPY N/A 11/05/2017   Procedure: COLONOSCOPY;  Surgeon: Daneil Dolin, MD;  Location: [facility];  Service: Endoscopy;  Laterality: N/A;  12:00   HIP PINNING,CANNULATED Right 07/24/2021   Procedure: CANNULATED HIP PINNING;  Surgeon: Carole Civil, MD;  Location: [facility] ORS;  Service: Orthopedics;  Laterality: Right;   POLYPECTOMY   11/05/2017   Procedure: POLYPECTOMY;  Surgeon: Daneil Dolin, MD;  Location: [facility];  Service: Endoscopy;;  colon    Family History  Problem Relation Age of Onset   Hypertension Mother    Cancer Mother    Hypertension Father    Heart failure Other    Cancer Other     Social History   Socioeconomic History   Marital status: Legally Separated    Spouse name: Not on file   Number of children: Not on file   Years of education: Not on file   Highest education level: Not on file  Occupational History   Not on file  Tobacco Use   Smoking status: Former    Packs/day: 0.50    Years: 43.00    Total pack years: 21.50    Types: Cigarettes   Smokeless tobacco: Never   Tobacco comments:    last used 2 days ago  Vaping Use   Vaping Use: Never used  Substance and Sexual Activity   Alcohol use: Yes    Comment: beer 2 cans per day   Drug use: Yes    Types: Marijuana   Sexual activity: Yes    Birth control/protection: None  Other Topics Concern   Not on file  Social History Narrative   Not on file   Social Determinants of Health   Financial  Resource Strain: Not on file  Food Insecurity: Not on file  Transportation Needs: Not on file  Physical Activity: Not on file  Stress: Not on file  Social Connections: Not on file  Intimate Partner Violence: Not on file    ROS      Objective    There were no vitals taken for this visit.  Physical Exam  {Labs (Optional):23779}    Assessment & Plan:   Problem List Items Addressed This Visit   None   No follow-ups on file.   Johnette Abraham, MD

## 2021-08-28 ENCOUNTER — Encounter: Payer: Medicare Other | Admitting: Orthopedic Surgery

## 2021-08-28 DIAGNOSIS — S72001D Fracture of unspecified part of neck of right femur, subsequent encounter for closed fracture with routine healing: Secondary | ICD-10-CM

## 2021-09-11 ENCOUNTER — Encounter: Payer: Medicare Other | Admitting: Orthopedic Surgery

## 2021-10-12 ENCOUNTER — Other Ambulatory Visit: Payer: Self-pay

## 2021-10-12 ENCOUNTER — Emergency Department (HOSPITAL_COMMUNITY)
Admission: EM | Admit: 2021-10-12 | Discharge: 2021-10-12 | Disposition: A | Discharge status: Home / Self Care | Payer: Medicare Other | Attending: Emergency Medicine | Admitting: Emergency Medicine

## 2021-10-12 ENCOUNTER — Emergency Department (HOSPITAL_COMMUNITY): Payer: Medicare Other

## 2021-10-12 ENCOUNTER — Encounter (HOSPITAL_COMMUNITY): Payer: Self-pay | Admitting: Emergency Medicine

## 2021-10-12 DIAGNOSIS — R110 Nausea: Secondary | ICD-10-CM | POA: Diagnosis not present

## 2021-10-12 DIAGNOSIS — R791 Abnormal coagulation profile: Secondary | ICD-10-CM | POA: Diagnosis not present

## 2021-10-12 DIAGNOSIS — R0789 Other chest pain: Secondary | ICD-10-CM | POA: Diagnosis present

## 2021-10-12 DIAGNOSIS — E876 Hypokalemia: Secondary | ICD-10-CM | POA: Diagnosis not present

## 2021-10-12 DIAGNOSIS — Z79899 Other long term (current) drug therapy: Secondary | ICD-10-CM | POA: Insufficient documentation

## 2021-10-12 DIAGNOSIS — W19XXXA Unspecified fall, initial encounter: Secondary | ICD-10-CM | POA: Diagnosis not present

## 2021-10-12 HISTORY — DX: Cerebral infarction, unspecified: I63.9

## 2021-10-12 HISTORY — DX: Fracture of unspecified part of neck of unspecified femur, initial encounter for closed fracture: S72.009A

## 2021-10-12 HISTORY — DX: Essential (primary) hypertension: I10

## 2021-10-12 LAB — CBC
HCT: 41.6 % (ref 39.0–52.0)
Hemoglobin: 13.8 g/dL (ref 13.0–17.0)
MCH: 30.6 pg (ref 26.0–34.0)
MCHC: 33.2 g/dL (ref 30.0–36.0)
MCV: 92.2 fL (ref 80.0–100.0)
Platelets: 286 10*3/uL (ref 150–400)
RBC: 4.51 MIL/uL (ref 4.22–5.81)
RDW: 14.2 % (ref 11.5–15.5)
WBC: 10.7 10*3/uL — ABNORMAL HIGH (ref 4.0–10.5)
nRBC: 0 % (ref 0.0–0.2)

## 2021-10-12 LAB — BASIC METABOLIC PANEL
Anion gap: 5 (ref 5–15)
BUN: 13 mg/dL (ref 8–23)
CO2: 30 mmol/L (ref 22–32)
Calcium: 8.8 mg/dL — ABNORMAL LOW (ref 8.9–10.3)
Chloride: 109 mmol/L (ref 98–111)
Creatinine, Ser: 0.97 mg/dL (ref 0.61–1.24)
GFR, Estimated: >60 mL/min (ref >60)
Glucose, Bld: 78 mg/dL (ref 70–99)
Potassium: 3.1 mmol/L — ABNORMAL LOW (ref 3.5–5.1)
Sodium: 144 mmol/L (ref 135–145)

## 2021-10-12 LAB — D-DIMER, QUANTITATIVE: D-Dimer, Quant: 0.96 ug/mL-FEU — ABNORMAL HIGH (ref 0.00–0.50)

## 2021-10-12 LAB — TROPONIN I (HIGH SENSITIVITY)
Troponin I (High Sensitivity): 5 ng/L (ref <18)
Troponin I (High Sensitivity): 3 ng/L (ref <18)

## 2021-10-12 MED ORDER — POTASSIUM CHLORIDE CRYS ER 20 MEQ PO TBCR
40.0000 meq | EXTENDED_RELEASE_TABLET | Freq: Once | ORAL | Status: AC
  Administered 2021-10-12: 40 meq via ORAL
  Filled 2021-10-12: qty 2

## 2021-10-12 MED ORDER — POTASSIUM CHLORIDE CRYS ER 20 MEQ PO TBCR: 20.0000 meq | EXTENDED_RELEASE_TABLET | Freq: Two times a day (BID) | ORAL | 0 refills | Status: DC

## 2021-10-12 MED ORDER — IOHEXOL 350 MG/ML SOLN
75.0000 mL | Freq: Once | INTRAVENOUS | Status: AC | PRN
  Administered 2021-10-12: 75 mL via INTRAVENOUS

## 2021-10-12 NOTE — Discharge Instructions (Signed)
Your potassium level today was low.  You have been prescribed a potassium supplement to help with this.  Please take the medication as directed.  The x-ray and CT of your chest did not show evidence of a blood clot, broken bone or pneumonia.  You may take Tylenol 500 mg every 4 hours if needed for pain.  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  This can be followed by your primary care provider with a yearly CT of your chest.  Please call your primary care provider to arrange a follow-up appointment in 1 to 2 weeks to have your potassium level rechecked.  Return to the emergency department for any new or worsening symptoms.

## 2021-10-12 NOTE — ED Provider Notes (Signed)
[facility] EMERGENCY DEPARTMENT Provider Note   CSN: [id number] Arrival date & time: 10/12/21  1035     History  Chief Complaint  Patient presents with   Chest Pain    Austin [name] is a 66 y.o. male.   Chest Pain Associated symptoms: nausea   Associated symptoms: no abdominal pain, no back pain, no diaphoresis, no dizziness, no fever, no numbness, no shortness of breath, no vomiting and no weakness        Austin [name] is a 66 y.o. male who presents to the Emergency Department complaining of central chest pain x2 to 3 days.  Pain is described as pleuritic, worsening with cough, certain movements and deep breathing.  He endorses a fall 1 week ago, but does not believe that he injured his chest during the fall.  Pain is not radiating, he denies any shortness of breath, neck, jaw or arm pain.  No prior cardiac history.  No prior history of PE or DVT.  Currently, he describes his pain is minimal and states the only reason he is here is because his daughter made him come in to have his pain evaluated.  Home Medications Prior to Admission medications   Medication Sig Start Date End Date Taking? Authorizing Provider  amLODipine (NORVASC) 10 MG tablet Take 1 tablet (10 mg total) by mouth daily. 07/27/21 08/26/21  Shahmehdi, Valeria Batman, MD  docusate sodium (COLACE) 100 MG capsule Take 1 capsule (100 mg total) by mouth 2 (two) times daily. 07/26/21   Shahmehdi, Valeria Batman, MD  metoprolol succinate (TOPROL-XL) 50 MG 24 hr tablet Take 1 tablet (50 mg total) by mouth daily. Take with or immediately following a meal. 07/27/21 08/26/21  Shahmehdi, Valeria Batman, MD  Na Sulfate-K Sulfate-Mg Sulf (SUPREP BOWEL PREP KIT) 17.5-3.13-1.6 GM/177ML SOLN Take 1 kit by mouth as directed. Patient not taking: Reported on 07/23/2021 09/23/17   Annitta Needs, NP  nicotine (NICODERM CQ - DOSED IN MG/24 HOURS) 14 mg/24hr patch Place 1 patch (14 mg total) onto the skin daily. 07/26/21   Shahmehdi, Valeria Batman, MD  ondansetron  (ZOFRAN) 4 MG tablet Take 1 tablet (4 mg total) by mouth every 6 (six) hours as needed for nausea. 07/26/21   Deatra James, MD  traZODone (DESYREL) 50 MG tablet Take 0.5 tablets (25 mg total) by mouth at bedtime as needed for up to 20 days for sleep. 07/26/21 08/15/21  Deatra James, MD      Allergies    Fish allergy    Review of Systems   Review of Systems  Constitutional:  Negative for diaphoresis and fever.  Respiratory:  Negative for chest tightness, shortness of breath and wheezing.   Cardiovascular:  Positive for chest pain. Negative for leg swelling.  Gastrointestinal:  Positive for nausea. Negative for abdominal pain, diarrhea and vomiting.  Musculoskeletal:  Negative for back pain and neck pain.  Skin:  Negative for rash.  Neurological:  Negative for dizziness, weakness, light-headedness and numbness.  Psychiatric/Behavioral:  Negative for confusion.     Physical Exam Updated Vital Signs BP (!) 157/91   Pulse 60   Temp 97.6 F (36.4 C) (Oral)   Resp 12   Ht $R'5\' 8"'yg$  (1.727 m)   Wt 70.3 kg   SpO2 100%   BMI 23.57 kg/m  Physical Exam Vitals and nursing note reviewed.  Constitutional:      General: He is not in acute distress.    Appearance: He is well-developed. He is  not toxic-appearing.  HENT:     Head: Atraumatic.     Mouth/Throat:     Mouth: Mucous membranes are moist.  Cardiovascular:     Rate and Rhythm: Normal rate and regular rhythm.     Pulses: Normal pulses.  Pulmonary:     Effort: Pulmonary effort is normal. No respiratory distress.     Breath sounds: Normal breath sounds. No wheezing.     Comments: Patient points to midsternal area as area of tenderness.  No tenderness to palpation, no crepitus.  No skin changes or edema.  Ribs are nontender to palpation bilaterally. Chest:     Chest wall: No tenderness.  Abdominal:     General: There is no distension.     Palpations: Abdomen is soft.     Tenderness: There is no abdominal tenderness.   Musculoskeletal:        General: Normal range of motion.  Skin:    General: Skin is warm.     Capillary Refill: Capillary refill takes less than 2 seconds.     Findings: No rash.  Neurological:     General: No focal deficit present.     Mental Status: He is alert.     Sensory: No sensory deficit.     Motor: No weakness.     ED Results / Procedures / Treatments   Labs (all labs ordered are listed, but only abnormal results are displayed) Labs Reviewed  BASIC METABOLIC PANEL - Abnormal; Notable for the following components:      Result Value   Potassium 3.1 (*)    Calcium 8.8 (*)    All other components within normal limits  CBC - Abnormal; Notable for the following components:   WBC 10.7 (*)    All other components within normal limits  D-DIMER, QUANTITATIVE - Abnormal; Notable for the following components:   D-Dimer, Quant 0.96 (*)    All other components within normal limits  TROPONIN I (HIGH SENSITIVITY)  TROPONIN I (HIGH SENSITIVITY)    EKG EKG Interpretation  Date/Time:  Sunday October 12 2021 10:46:57 EDT Ventricular Rate:  72 PR Interval:  159 QRS Duration: 92 QT Interval:  399 QTC Calculation: 437 R Axis:   40 Text Interpretation: Sinus rhythm Atrial premature complexes Low voltage, extremity leads Anteroseptal infarct, old Minimal ST depression Baseline wander in lead(s) V4 Confirmed by Campbell Stall ([id number]) on 53/09/7671 1:43:07 PM  Radiology CT Angio Chest PE W and/or Wo Contrast  Result Date: 10/12/2021 CLINICAL DATA:  Chest pain for 2-3 days. Positive D-dimer. High clinical suspicion for pulmonary embolism. EXAM: CT ANGIOGRAPHY CHEST WITH CONTRAST TECHNIQUE: Multidetector CT imaging of the chest was performed using the standard protocol during bolus administration of intravenous contrast. Multiplanar CT image reconstructions and MIPs were obtained to evaluate the vascular anatomy. RADIATION DOSE REDUCTION: This exam was performed according to the departmental  dose-optimization program which includes automated exposure control, adjustment of the mA and/or kV according to patient size and/or use of iterative reconstruction technique. CONTRAST:  72mL OMNIPAQUE IOHEXOL 350 MG/ML SOLN COMPARISON:  08/03/2010 FINDINGS: Cardiovascular: Satisfactory opacification of pulmonary arteries noted, and no pulmonary emboli identified. No evidence of thoracic aortic dissection. 4.0 cm ascending thoracic aortic aneurysm noted. Aortic and coronary atherosclerotic calcification incidentally noted. Mild-to-moderate cardiomegaly. Mediastinum/Nodes: No masses or pathologically enlarged lymph nodes identified. Lungs/Pleura: No pulmonary mass, infiltrate, or effusion. Band like opacity in the right lower lobe may be due to atelectasis or scarring. Upper abdomen: No acute findings. Musculoskeletal: No suspicious  bone lesions identified. Review of the MIP images confirms the above findings. IMPRESSION: No evidence of pulmonary embolism. Right lower lobe atelectasis versus scarring. 4.0 cm ascending thoracic aortic aneurysm. Recommend annual imaging followup by CT. This recommendation follows 2010 ACCF/AHA/AATS/ACR/ASA/SCA/SCAI/SIR/STS/SVM Guidelines for the Diagnosis and Management of Patients with Thoracic Aortic Disease. Circulation. 2010; 121: D741-O878. Aortic aneurysm NOS (ICD10-I71.9) Aortic Atherosclerosis (ICD10-I70.0). Electronically Signed   By: Marlaine Hind M.D.   On: 10/12/2021 13:22   DG Chest 2 View  Result Date: 10/12/2021 CLINICAL DATA:  Chest pain and cough. EXAM: CHEST - 2 VIEW COMPARISON:  08/03/2010 CT, radiograph and prior studies FINDINGS: The cardiomediastinal silhouette is unremarkable. Posterior bibasilar scarring again noted. There is no evidence of focal airspace disease, pulmonary edema, suspicious pulmonary nodule/mass, pleural effusion, or pneumothorax. No acute bony abnormalities are identified. IMPRESSION: No active cardiopulmonary disease. Electronically Signed    By: Margarette Canada M.D.   On: 10/12/2021 11:44    Procedures Procedures    Medications Ordered in ED Medications  iohexol (OMNIPAQUE) 350 MG/ML injection 75 mL (75 mLs Intravenous Contrast Given 10/12/21 1300)    ED Course/ Medical Decision Making/ A&P                           Medical Decision Making Patient here for evaluation of central chest pain for 2 to 3 days.  Notes history of fall 1 week ago but does not believe that he injured his chest.  Pain is nonradiating and not associated with any dyspnea arm neck or jaw pain.  Pain is described as pleuritic, no history of recent travel or prior PE or DVT.  On exam, patient well-appearing nontoxic.  Resting comfortably.  Lungs are clear to auscultation bilaterally, no increased work of breathing on my exam.  He describes pain of his central chest, this pain is not reproducible on my exam.  There is no bony crepitus, skin changes or other signs of injury to the chest.  Differential diagnosis would include but not limited to musculoskeletal injury, pneumonia, ACS, PE  Patient will need work-up to include EKG, imaging, and labs including delta troponin.  Amount and/or Complexity of Data Reviewed Labs: ordered.    Details: Labs interpreted by me, no significant leukocytosis.  Chemistries show mild hypokalemia.  This appears to be baseline.  D-dimer slightly elevated at 0.96.  Reassuring delta troponin. Radiology: ordered.    Details: Chest x-ray shows no acute cardiopulmonary process  With elevated D-dimer outside of age-adjusted window, CT angio of the chest was ordered.  No evidence of pulmonary embolism, there is a 4 cm ascending thoracic aortic aneurysm with recommendations for annual CT follow-up. ECG/medicine tests: ordered.    Details: EKG reviewed, shows sinus rhythm with atrial premature complexes Discussion of management or test interpretation with external provider(s): On recheck, patient resting comfortably.  He is requesting  discharge home.  I feel that this is appropriate.  His work-up today overall reassuring.  He was given dose of oral potassium and prescription for same.  Discussed CT findings with the patient along with importance of blood pressure control.  He verbalized understanding and agrees to close outpatient follow-up regarding the aneurysm.  Risk Prescription drug management.           Final Clinical Impression(s) / ED Diagnoses Final diagnoses:  Chest wall pain  Hypokalemia    Rx / DC Orders ED Discharge Orders     None  Kem Parkinson, PA-C 39/35/94 0905    Campbell Stall P, DO 02/56/15 1801

## 2021-10-12 NOTE — ED Triage Notes (Signed)
Pt to ER with c/o mid sternal chest pain with bending over for last 2-3 days.  Pt denies SHOB, n/v or other associated symptoms.

## 2021-10-17 ENCOUNTER — Telehealth: Payer: Self-pay

## 2021-10-17 NOTE — Telephone Encounter (Signed)
        Patient  visited [facility] on 10/8    Telephone encounter attempt :  1st  A HIPAA compliant voice message was left requesting a return call.  Instructed patient to call back    [facility]  [phone] [address], [place], [place] [address] Phone: [phone] Email: [email or web address]

## 2021-10-30 ENCOUNTER — Telehealth: Payer: Self-pay | Admitting: Radiology

## 2021-10-30 NOTE — Telephone Encounter (Signed)
Austin [name] from [facility] I think, called said patient missed visit today, no particular reason, just missed.

## 2022-01-16 ENCOUNTER — Ambulatory Visit: Payer: Medicare Other | Admitting: Family Medicine

## 2022-02-13 ENCOUNTER — Telehealth: Payer: Medicare Other | Admitting: Family Medicine

## 2022-02-27 ENCOUNTER — Ambulatory Visit: Payer: Medicare Other | Admitting: Family Medicine

## 2022-03-05 ENCOUNTER — Encounter: Payer: Self-pay | Admitting: Radiology

## 2022-04-13 DIAGNOSIS — I739 Peripheral vascular disease, unspecified: Secondary | ICD-10-CM | POA: Diagnosis not present

## 2022-04-13 DIAGNOSIS — Z91013 Allergy to seafood: Secondary | ICD-10-CM | POA: Diagnosis not present

## 2022-04-13 DIAGNOSIS — Z720 Tobacco use: Secondary | ICD-10-CM | POA: Diagnosis not present

## 2022-04-13 DIAGNOSIS — G3184 Mild cognitive impairment, so stated: Secondary | ICD-10-CM | POA: Diagnosis not present

## 2022-04-13 DIAGNOSIS — Z5986 Financial insecurity: Secondary | ICD-10-CM | POA: Diagnosis not present

## 2022-04-13 DIAGNOSIS — I10 Essential (primary) hypertension: Secondary | ICD-10-CM | POA: Diagnosis not present

## 2022-04-13 DIAGNOSIS — Z008 Encounter for other general examination: Secondary | ICD-10-CM | POA: Diagnosis not present

## 2022-04-13 DIAGNOSIS — Z5982 Transportation insecurity: Secondary | ICD-10-CM | POA: Diagnosis not present

## 2022-04-13 DIAGNOSIS — M1990 Unspecified osteoarthritis, unspecified site: Secondary | ICD-10-CM | POA: Diagnosis not present

## 2022-06-10 ENCOUNTER — Ambulatory Visit (INDEPENDENT_AMBULATORY_CARE_PROVIDER_SITE_OTHER): Payer: Medicare HMO | Admitting: Internal Medicine

## 2022-06-10 ENCOUNTER — Encounter: Payer: Self-pay | Admitting: Internal Medicine

## 2022-06-10 VITALS — BP 167/81 | HR 88 | Resp 16 | Ht 68.0 in | Wt 150.0 lb

## 2022-06-10 DIAGNOSIS — Z0001 Encounter for general adult medical examination with abnormal findings: Secondary | ICD-10-CM | POA: Diagnosis not present

## 2022-06-10 DIAGNOSIS — F1027 Alcohol dependence with alcohol-induced persisting dementia: Secondary | ICD-10-CM

## 2022-06-10 DIAGNOSIS — I10 Essential (primary) hypertension: Secondary | ICD-10-CM | POA: Diagnosis not present

## 2022-06-10 MED ORDER — AMLODIPINE BESYLATE 10 MG PO TABS: 10.0000 mg | ORAL_TABLET | Freq: Every day | ORAL | 1 refills | Status: DC

## 2022-06-10 NOTE — Patient Instructions (Signed)
Thank you, [name] for allowing [place] to provide your care today.   I have ordered the following labs for you:   Lab Orders         Hemoglobin A1c         TSH         CMP14+EGFR         Lipid panel         Hepatitis C Antibody        Reminders: Keep 7 blood pressure readings and schedule follow up in 2 weeks. Sign for release of records.     Thurmon Fair, M.D.

## 2022-06-10 NOTE — Progress Notes (Signed)
HPI:[name] is a 67 y.o. 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 His daughter, Dillard Essex, accompanies him today. She has power of attorney and caretaker for her father. She tries to prevent patient from drinking alcohol, but he continues to be enabled by neighbors sometimes.  Patient has been out of his amlodipine 10 mg. He is not taking Metoprolol which was added during a hospital stay. For the details of today's visit, please refer to the assessment and plan.   Past Medical History:  Diagnosis Date   Dyspnea    Hip fracture (HCC)    Hypertension    Stroke [facility])     Past Surgical History:  Procedure Laterality Date   COLONOSCOPY N/A 11/05/2017   Procedure: COLONOSCOPY;  Surgeon: Corbin Ade, MD;  Location: [facility] ENDO SUITE;  Service: Endoscopy;  Laterality: N/A;  12:00   HIP PINNING,CANNULATED Right 07/24/2021   Procedure: CANNULATED HIP PINNING;  Surgeon: Vickki Hearing, MD;  Location: [facility] ORS;  Service: Orthopedics;  Laterality: Right;   POLYPECTOMY  11/05/2017   Procedure: POLYPECTOMY;  Surgeon: Corbin Ade, MD;  Location: [facility] ENDO SUITE;  Service: Endoscopy;;  colon    Family History  Problem Relation Age of Onset   Kidney disease Mother    Hypertension Mother    Cancer Mother    Hypertension Father    Atrial fibrillation Father    Heart attack Father    Heart failure Other    Cancer Other     Social History   Tobacco Use   Smoking status: Former    Packs/day: 2.00    Years: 43.00    Additional pack years: 0.00    Total pack years: 86.00    Types: Cigarettes   Smokeless tobacco: Never  Vaping Use   Vaping Use: Never used  Substance Use Topics   Alcohol use: Yes    Comment: beer 2 cans per day   Drug use: Yes    Types: Marijuana   Physical Exam: Vitals:   06/10/22 1617  BP: (!) 167/81  Pulse: 88  Resp: 16  SpO2: 96%  Weight: 150 lb (68 kg)  Height: 5\' 8"   (1.727 m)     Physical Exam Constitutional:      Appearance: He is normal weight.  Cardiovascular:     Rate and Rhythm: Normal rate and regular rhythm.     Heart sounds: No murmur heard. Pulmonary:     Effort: Pulmonary effort is normal.     Breath sounds: No wheezing or rales.  Neurological:     General: No focal deficit present.     Mental Status: He is alert.     Comments: Oriented to person and place, not to time      Assessment & Plan:   Encounter for general adult medical examination with abnormal findings Patient here to establish care. No lipid panel or hemoglobin A1c ion review of previous labs.Will screen for HLD and diabetes today. One time screening for hepatitis C ordered.   Alcohol-induced persisting dementia [facility]) Patient has dementia and likely alcohol induced. He is alert to person and place on exam, but not to time. He is off by a few years when asked year, age, and also not aware of month. He is pleasant and cooperative. Ask for patient/POA to sign for record release today. Recommend avoiding all alcohol use. No TSH on review of previous result.  Check TSH.  Essential hypertension BP 167/81. He has not taken amlodipine.  Chronic problem , uncontrolled Refilled amlodipine 10 mg. Recommend ambulatory blood pressure monitoring and follow up in 4 weeks     Milus Banister, MD

## 2022-06-11 ENCOUNTER — Telehealth: Payer: Self-pay | Admitting: Internal Medicine

## 2022-06-11 NOTE — Telephone Encounter (Signed)
Handicap placard   Noted  Copied Sleeved  Original in PCP box Copy front desk folder

## 2022-06-12 DIAGNOSIS — F1027 Alcohol dependence with alcohol-induced persisting dementia: Secondary | ICD-10-CM | POA: Insufficient documentation

## 2022-06-12 DIAGNOSIS — Z0001 Encounter for general adult medical examination with abnormal findings: Secondary | ICD-10-CM | POA: Insufficient documentation

## 2022-06-12 NOTE — Assessment & Plan Note (Signed)
BP 167/81. He has not taken amlodipine.  Chronic problem , uncontrolled Refilled amlodipine 10 mg. Recommend ambulatory blood pressure monitoring and follow up in 4 weeks

## 2022-06-12 NOTE — Telephone Encounter (Signed)
Pt aware forms are ready for pick up

## 2022-06-12 NOTE — Assessment & Plan Note (Signed)
Patient has dementia and likely alcohol induced. He is alert to person and place on exam, but not to time. He is off by a few years when asked year, age, and also not aware of month. He is pleasant and cooperative. Ask for patient/POA to sign for record release today. Recommend avoiding all alcohol use. No TSH on review of previous result. Check TSH.

## 2022-06-12 NOTE — Assessment & Plan Note (Signed)
Patient here to establish care. No lipid panel or hemoglobin A1c ion review of previous labs.Will screen for HLD and diabetes today. One time screening for hepatitis C ordered.

## 2022-06-16 LAB — CMP14+EGFR
ALT: 6 IU/L (ref 0–44)
AST: 13 IU/L (ref 0–40)
Albumin/Globulin Ratio: 1.6 (ref 1.2–2.2)
Albumin: 4.6 g/dL (ref 3.9–4.9)
Alkaline Phosphatase: 63 IU/L (ref 44–121)
BUN/Creatinine Ratio: 6 — ABNORMAL LOW (ref 10–24)
BUN: 6 mg/dL — ABNORMAL LOW (ref 8–27)
Bilirubin Total: 0.4 mg/dL (ref 0.0–1.2)
CO2: 22 mmol/L (ref 20–29)
Calcium: 8.9 mg/dL (ref 8.6–10.2)
Chloride: 104 mmol/L (ref 96–106)
Creatinine, Ser: 1.03 mg/dL (ref 0.76–1.27)
Globulin, Total: 2.8 g/dL (ref 1.5–4.5)
Glucose: 67 mg/dL — ABNORMAL LOW (ref 70–99)
Potassium: 4.5 mmol/L (ref 3.5–5.2)
Sodium: 145 mmol/L — ABNORMAL HIGH (ref 134–144)
Total Protein: 7.4 g/dL (ref 6.0–8.5)
eGFR: 79 mL/min/{1.73_m2} (ref >59)

## 2022-06-16 LAB — HEMOGLOBIN A1C
Est. average glucose Bld gHb Est-mCnc: 111 mg/dL
Hgb A1c MFr Bld: 5.5 % (ref 4.8–5.6)

## 2022-06-16 LAB — TSH: TSH: 2.32 u[IU]/mL (ref 0.450–4.500)

## 2022-06-16 LAB — LIPID PANEL
Chol/HDL Ratio: 2.2 ratio (ref 0.0–5.0)
Cholesterol, Total: 152 mg/dL (ref 100–199)
HDL: 70 mg/dL (ref >39)
LDL Chol Calc (NIH): 72 mg/dL (ref 0–99)
Triglycerides: 44 mg/dL (ref 0–149)
VLDL Cholesterol Cal: 10 mg/dL (ref 5–40)

## 2022-06-16 LAB — HEPATITIS C ANTIBODY: Hep C Virus Ab: NONREACTIVE

## 2022-07-08 ENCOUNTER — Ambulatory Visit: Payer: Medicare HMO | Admitting: Internal Medicine

## 2022-07-13 ENCOUNTER — Encounter: Payer: Self-pay | Admitting: Internal Medicine

## 2022-07-13 ENCOUNTER — Telehealth (INDEPENDENT_AMBULATORY_CARE_PROVIDER_SITE_OTHER): Payer: Medicare HMO | Admitting: Internal Medicine

## 2022-07-13 VITALS — BP 147/92

## 2022-07-13 DIAGNOSIS — I10 Essential (primary) hypertension: Secondary | ICD-10-CM | POA: Diagnosis not present

## 2022-07-13 MED ORDER — OLMESARTAN MEDOXOMIL 20 MG PO TABS: 20.0000 mg | ORAL_TABLET | Freq: Every day | ORAL | 0 refills | Status: AC

## 2022-07-13 NOTE — Patient Instructions (Addendum)
Continue amlodipine 10 mg Start Olmesartan 20 mg Keep blood pressure readings, at least 7 readings.  Follow up in 6 weeks for blood work and blood pressure check

## 2022-07-13 NOTE — Progress Notes (Signed)
   Virtual Visit via Video Note  I connected with Austin [name] on 07/13/22 at  2:00 PM EDT by a video enabled telemedicine application and verified that I am speaking with the correct person using two identifiers.  Patient Location: Home Provider Location: Office/Clinic  I discussed the limitations, risks, security, and privacy concerns of performing an evaluation and management service by video and the availability of in person appointments. I also discussed with the patient that there may be a patient responsible charge related to this service. The patient expressed understanding and agreed to proceed.  Subjective: PCP: Austin Lade, MD  Chief Complaint  Patient presents with   Hypertension    Follow up   Austin [name] is a 67 year old with pertinent history of  HTN and alcohol induced dementia who present for hypertension  His daughter, Austin [name] ,  is his caretaker . She has checked ambulatory blood pressure reading and patient's average is 147/92 on amlodopine 10 mg. We discussed different options for blood pressure. I would like to avoid hydrochlorothiazide at this time given he has dementia , doesn't drink water frequently, and therefore is at increased risk for dehydration.    ROS: Per HPI  Current Outpatient Medications:    amLODipine (NORVASC) 10 MG tablet, Take 1 tablet (10 mg total) by mouth daily., Disp: 90 tablet, Rfl: 1  Observations/Objective: Today's Vitals   07/13/22 1426  BP: (!) 147/92   There is no height or weight on file to calculate BMI.  Assessment and Plan: Essential hypertension Assessment & Plan: BP remains above goal of 140/90 on home blood pressure monitoring.  Chronic problem, uncontrolled Continue amlodipine 10 mg Start Olmesartan 20 mg Keep blood pressure readings, at least 7 readings.  Follow up in 6 weeks for blood work and blood pressure     Follow Up Instructions: Return in about 6 weeks (around 08/24/2022).   I discussed  the assessment and treatment plan with the patient. The patient was provided an opportunity to ask questions, and all were answered. The patient agreed with the plan and demonstrated an understanding of the instructions.   The patient was advised to call back or seek an in-person evaluation if the symptoms worsen or if the condition fails to improve as anticipated.  The above assessment and management plan was discussed with the patient. The patient verbalized understanding of and has agreed to the management plan.   Milus Banister, MD

## 2022-07-13 NOTE — Assessment & Plan Note (Signed)
BP remains above goal of 140/90 on home blood pressure monitoring.  Chronic problem, uncontrolled Continue amlodipine 10 mg Start Olmesartan 20 mg Keep blood pressure readings, at least 7 readings.  Follow up in 6 weeks for blood work and blood pressure

## 2022-07-22 NOTE — Telephone Encounter (Signed)
Patient pick-up forms.

## 2022-10-08 ENCOUNTER — Telehealth (INDEPENDENT_AMBULATORY_CARE_PROVIDER_SITE_OTHER): Payer: Medicare HMO | Admitting: Internal Medicine

## 2022-10-08 ENCOUNTER — Encounter: Payer: Self-pay | Admitting: Internal Medicine

## 2022-10-15 ENCOUNTER — Encounter: Payer: Self-pay | Admitting: *Deleted

## 2022-10-25 NOTE — Progress Notes (Signed)
Erroneous encounter

## 2022-11-19 ENCOUNTER — Ambulatory Visit: Payer: Medicare HMO

## 2022-11-19 VITALS — Ht 69.0 in | Wt 140.0 lb

## 2022-11-19 DIAGNOSIS — Z0100 Encounter for examination of eyes and vision without abnormal findings: Secondary | ICD-10-CM

## 2022-11-19 DIAGNOSIS — Z0000 Encounter for general adult medical examination without abnormal findings: Secondary | ICD-10-CM

## 2022-11-19 DIAGNOSIS — Z122 Encounter for screening for malignant neoplasm of respiratory organs: Secondary | ICD-10-CM

## 2022-11-19 NOTE — Patient Instructions (Signed)
Austin [name] , Thank you for taking time to come for your Medicare Wellness Visit. I appreciate your ongoing commitment to your health goals. Please review the following plan we discussed and let me know if I can assist you in the future.   Referrals/Orders/Follow-Ups/Clinician Recommendations: Aim for 30 minutes of exercise or brisk walking, 6-8 glasses of water, and 5 servings of fruits and vegetables each day.   This is a list of the screening recommended for you and due dates:  Health Maintenance  Topic Date Due   DTaP/Tdap/Td vaccine (1 - Tdap) Never done   Zoster (Shingles) Vaccine (1 of 2) Never done   Pneumonia Vaccine (1 of 1 - PCV) Never done   Flu Shot  Never done   COVID-19 Vaccine (1 - 2023-24 season) Never done   Screening for Lung Cancer  10/13/2022   Medicare Annual Wellness Visit  11/19/2023   Colon Cancer Screening  11/06/2027   Hepatitis C Screening  Completed   HPV Vaccine  Aged Out    Advanced directives: (Copy Requested) Please bring a copy of your health care power of attorney and living will to the office to be added to your chart at your convenience.  Next Medicare Annual Wellness Visit scheduled for next year: Yes  insert Preventive Care attachment Insert FALL PREVENTION attachment if needed

## 2022-11-19 NOTE — Progress Notes (Signed)
Subjective:   Austin [name] is a 67 y.o. male who presents for an Initial Medicare Annual Wellness Visit.  Visit Complete: Virtual I connected with  Austin [name] on 11/19/22 by a audio enabled telemedicine application and verified that I am speaking with the correct person using two identifiers.  Patient Location: Home  Provider Location: Home Office  I discussed the limitations of evaluation and management by telemedicine. The patient expressed understanding and agreed to proceed.  Vital Signs: Because this visit was a virtual/telehealth visit, some criteria may be missing or patient reported. Any vitals not documented were not able to be obtained and vitals that have been documented are patient reported.  Patient Medicare AWV questionnaire was completed by the patient on 11/19/2022; I have confirmed that all information answered by patient is correct and no changes since this date.  Cardiac Risk Factors include: advanced age (>32men, >49 women);male gender;hypertension;sedentary lifestyle     Objective:    Today's Vitals   11/19/22 1330  Weight: 140 lb (63.5 kg)  Height: 5\' 9"  (1.753 m)   Body mass index is 20.67 kg/m.     11/19/2022    1:35 PM 10/12/2021   10:49 AM 07/24/2021   11:57 AM 07/23/2021   12:50 AM 07/19/2021    3:49 PM 11/05/2017   11:05 AM  Advanced Directives  Does Patient Have a Medical Advance Directive? Yes No No No No No  Type of Estate agent of [place];Living will       Copy of Healthcare Power of Attorney in Chart? No - copy requested       Would patient like information on creating a medical advance directive?  No - Patient declined No - Patient declined No - Patient declined  No - Patient declined    Current Medications (verified) Outpatient Encounter Medications as of 11/19/2022  Medication Sig   amLODipine (NORVASC) 10 MG tablet Take 1 tablet (10 mg total) by mouth daily.   olmesartan (BENICAR) 20 MG tablet Take 1  tablet (20 mg total) by mouth daily.   No facility-administered encounter medications on file as of 11/19/2022.    Allergies (verified) Fish allergy   History: Past Medical History:  Diagnosis Date   Dyspnea    Hip fracture (HCC)    Hypertension    Stroke [facility])    Past Surgical History:  Procedure Laterality Date   COLONOSCOPY N/A 11/05/2017   Procedure: COLONOSCOPY;  Surgeon: Corbin Ade, MD;  [facility];  Service: Endoscopy;  Laterality: N/A;  12:00   HIP PINNING,CANNULATED Right 07/24/2021   Procedure: CANNULATED HIP PINNING;  Surgeon: Vickki Hearing, MD;  Location: [facility];  Service: Orthopedics;  Laterality: Right;   POLYPECTOMY  11/05/2017   Procedure: POLYPECTOMY;  Surgeon: Corbin Ade, MD;  [facility];  Service: Endoscopy;;  colon   Family History  Problem Relation Age of Onset   Kidney disease Mother    Hypertension Mother    Cancer Mother    Hypertension Father    Atrial fibrillation Father    Heart attack Father    Heart failure Other    Cancer Other    Social History   Socioeconomic History   Marital status: Legally Separated    Spouse name: Not on file   Number of children: Not on file   Years of education: Not on file   Highest education level: Not on file  Occupational History   Not on file  Tobacco Use   Smoking status: Former    Current packs/day: 2.00    Average packs/day: 2.0 packs/day for 43.0 years (86.0 ttl pk-yrs)    Types: Cigarettes   Smokeless tobacco: Never  Vaping Use   Vaping status: Never Used  Substance and Sexual Activity   Alcohol use: Yes    Comment: beer 2 cans per day   Drug use: Yes    Types: Marijuana   Sexual activity: Yes    Birth control/protection: None  Other Topics Concern   Not on file  Social History Narrative   Not on file   Social Determinants of Health   Financial Resource Strain: Low Risk  (11/19/2022)   Overall Financial Resource Strain (CARDIA)    Difficulty of  Paying Living Expenses: Not hard at all  Food Insecurity: No Food Insecurity (11/19/2022)   Hunger Vital Sign    Worried About Running Out of Food in the Last Year: Never true    Ran Out of Food in the Last Year: Never true  Transportation Needs: No Transportation Needs (11/19/2022)   PRAPARE - Administrator, Civil Service (Medical): No    Lack of Transportation (Non-Medical): No  Physical Activity: Inactive (11/19/2022)   Exercise Vital Sign    Days of Exercise per Week: 0 days    Minutes of Exercise per Session: 0 min  Stress: No Stress Concern Present (11/19/2022)   [employer] of Occupational Health - Occupational Stress Questionnaire    Feeling of Stress : Not at all  Social Connections: Socially Isolated (11/19/2022)   Social Connection and Isolation Panel [NHANES]    Frequency of Communication with Friends and Family: More than three times a week    Frequency of Social Gatherings with Friends and Family: More than three times a week    Attends Religious Services: Never    Database administrator or Organizations: No    Attends Engineer, structural: Never    Marital Status: Divorced    Tobacco Counseling Counseling given: Not Answered   Clinical Intake:  Pre-visit preparation completed: Yes  Pain : No/denies pain     Nutritional Risks: None Diabetes: No  How often do you need to have someone help you when you read instructions, pamphlets, or other written materials from your doctor or pharmacy?: 1 - Never  Interpreter Needed?: No  Information entered by :: Renie Ora, LPN   Activities of Daily Living    11/19/2022    1:36 PM  In your present state of health, do you have any difficulty performing the following activities:  Hearing? 0  Vision? 0  Difficulty concentrating or making decisions? 0  Walking or climbing stairs? 0  Dressing or bathing? 0  Doing errands, shopping? 0  Preparing Food and eating ? N  Using the Toilet? N   In the past six months, have you accidently leaked urine? N  Do you have problems with loss of bowel control? N  Managing your Medications? N  Managing your Finances? N  Housekeeping or managing your Housekeeping? N    Patient Care Team: Billie Lade, MD as PCP - General (Internal Medicine) West Bali, MD (Inactive) as Consulting Physician (Gastroenterology)  Indicate any recent Medical Services you may have received from other than [facility] providers in the past year (date may be approximate).     Assessment:   This is a routine wellness examination for Obe.  Hearing/Vision screen Vision Screening - Comments:: Referral 11/19/2022  Goals Addressed             This Visit's Progress    Exercise 3x per week (30 min per time)         Depression Screen    11/19/2022    1:33 PM 07/13/2022    1:46 PM 06/10/2022    4:20 PM  PHQ 2/9 Scores  PHQ - 2 Score 0 2 2  PHQ- 9 Score  4 4    Fall Risk    11/19/2022    1:32 PM 07/13/2022    1:46 PM 06/10/2022    4:20 PM  Fall Risk   Falls in the past year? 0 0 0  Number falls in past yr: 0 0 0  Injury with Fall? 0 0 0  Risk for fall due to : No Fall Risks No Fall Risks   Follow up Falls prevention discussed Falls evaluation completed     MEDICARE RISK AT HOME: Medicare Risk at Home Any stairs in or around the home?: No If so, are there any without handrails?: No Home free of loose throw rugs in walkways, pet beds, electrical cords, etc?: Yes Adequate lighting in your home to reduce risk of falls?: Yes Life alert?: No Use of a cane, walker or w/c?: No Grab bars in the bathroom?: Yes Shower chair or bench in shower?: Yes Elevated toilet seat or a handicapped toilet?: No  TIMED UP AND GO:  Was the test performed? No    Cognitive Function:        11/19/2022    1:36 PM  6CIT Screen  What Year? 0 points  What month? 0 points  What time? 0 points  Count back from 20 0 points  Months in reverse 2 points   Repeat phrase 2 points  Total Score 4 points    Immunizations  There is no immunization history on file for this patient.  TDAP status: Due, Education has been provided regarding the importance of this vaccine. Advised may receive this vaccine at local pharmacy or Health Dept. Aware to provide a copy of the vaccination record if obtained from local pharmacy or Health Dept. Verbalized acceptance and understanding.  Flu Vaccine status: Due, Education has been provided regarding the importance of this vaccine. Advised may receive this vaccine at local pharmacy or Health Dept. Aware to provide a copy of the vaccination record if obtained from local pharmacy or Health Dept. Verbalized acceptance and understanding.  Pneumococcal vaccine status: Due, Education has been provided regarding the importance of this vaccine. Advised may receive this vaccine at local pharmacy or Health Dept. Aware to provide a copy of the vaccination record if obtained from local pharmacy or Health Dept. Verbalized acceptance and understanding.  Covid-19 vaccine status: Declined, Education has been provided regarding the importance of this vaccine but patient still declined. Advised may receive this vaccine at local pharmacy or Health Dept.or vaccine clinic. Aware to provide a copy of the vaccination record if obtained from local pharmacy or Health Dept. Verbalized acceptance and understanding.  Qualifies for Shingles Vaccine? Yes   Zostavax completed No   Shingrix Completed?: No.    Education has been provided regarding the importance of this vaccine. Patient has been advised to call insurance company to determine out of pocket expense if they have not yet received this vaccine. Advised may also receive vaccine at local pharmacy or Health Dept. Verbalized acceptance and understanding.  Screening Tests Health Maintenance  Topic Date Due   DTaP/Tdap/Td (1 -  Tdap) Never done   Zoster Vaccines- Shingrix (1 of 2) Never done    Pneumonia Vaccine 71+ Years old (1 of 1 - PCV) Never done   INFLUENZA VACCINE  Never done   COVID-19 Vaccine (1 - 2023-24 season) Never done   Lung Cancer Screening  10/13/2022   Medicare Annual Wellness (AWV)  11/19/2023   Colonoscopy  11/06/2027   Hepatitis C Screening  Completed   HPV VACCINES  Aged Out    Health Maintenance  Health Maintenance Due  Topic Date Due   DTaP/Tdap/Td (1 - Tdap) Never done   Zoster Vaccines- Shingrix (1 of 2) Never done   Pneumonia Vaccine 30+ Years old (1 of 1 - PCV) Never done   INFLUENZA VACCINE  Never done   COVID-19 Vaccine (1 - 2023-24 season) Never done   Lung Cancer Screening  10/13/2022    Colorectal cancer screening: Type of screening: Colonoscopy. Completed 11/05/2017. Repeat every 10 years  Lung Cancer Screening: (Low Dose CT Chest recommended if Age 18-80 years, 20 pack-year currently smoking OR have quit w/in 15years.) does qualify.   Lung Cancer Screening Referral: referral 11/19/2022  Additional Screening:  Hepatitis C Screening: does not qualify; Completed 06/10/2022  Vision Screening: Recommended annual ophthalmology exams for early detection of glaucoma and other disorders of the eye. Is the patient up to date with their annual eye exam?  No  Who is the provider or what is the name of the office in which the patient attends annual eye exams? None , referral 11/19/2022 If pt is not established with a provider, would they like to be referred to a provider to establish care? Yes .   Dental Screening: Recommended annual dental exams for proper oral hygiene   Community Resource Referral / Chronic Care Management: CRR required this visit?  No   CCM required this visit?  No    Plan:     I have personally reviewed and noted the following in the patient's chart:   Medical and social history Use of alcohol, tobacco or illicit drugs  Current medications and supplements including opioid prescriptions. Patient is not  currently taking opioid prescriptions. Functional ability and status Nutritional status Physical activity Advanced directives List of other physicians Hospitalizations, surgeries, and ER visits in previous 12 months Vitals Screenings to include cognitive, depression, and falls Referrals and appointments  In addition, I have reviewed and discussed with patient certain preventive protocols, quality metrics, and best practice recommendations. A written personalized care plan for preventive services as well as general preventive health recommendations were provided to patient.     Lorrene Reid, LPN   [id number]   After Visit Summary: (MyChart) Due to this being a telephonic visit, the after visit summary with patients personalized plan was offered to patient via MyChart   Nurse Notes: Tilffany Entwistle Assisted with [facility] patient POA, NO vaccines on File will obtain medical records to bring to next office visit

## 2023-01-07 ENCOUNTER — Telehealth: Payer: Self-pay | Admitting: Internal Medicine

## 2023-01-07 NOTE — Telephone Encounter (Signed)
 Copied from CRM [id number]. Topic: Referral - Question >> Jan 07, 2023  1:10 PM Fredrica W wrote: Reason for CRM: Pt daughter  called and stated she is POA and she wanted to know if provider is able to have patient removed from [facility] services and changed to regular [facility] services. Per caller patient is not benefiting from [facility] and she wants to know if provider is able  to request it be changed. Thank You.

## 2023-01-12 ENCOUNTER — Encounter: Payer: Self-pay | Admitting: Internal Medicine

## 2023-01-18 ENCOUNTER — Telehealth: Payer: Self-pay | Admitting: Acute Care

## 2023-01-18 ENCOUNTER — Other Ambulatory Visit: Payer: Self-pay

## 2023-01-18 DIAGNOSIS — Z122 Encounter for screening for malignant neoplasm of respiratory organs: Secondary | ICD-10-CM

## 2023-01-18 DIAGNOSIS — F17210 Nicotine dependence, cigarettes, uncomplicated: Secondary | ICD-10-CM

## 2023-01-18 DIAGNOSIS — Z87891 Personal history of nicotine dependence: Secondary | ICD-10-CM

## 2023-01-18 NOTE — Telephone Encounter (Signed)
 Lung Cancer Screening Narrative/Criteria Questionnaire (Cigarette Smokers Only- No Cigars/Pipes/vapes)   Austin [name]     SDMV:02/04/23 at 130p                 PROVIDER:KRISTEN  May 29, 1955                          LDCT: 02/18/23 at 12 noon / [facility]    68 y.o.   Phone: [phone]  Lung Screening Narrative   Before calling, confirm age (50-77 yrs Medicare / 50-80 yrs Private pay insurance)  Insurance information: Paramedic / medicaid   I am calling at the request of [name]  (referring provider) to schedule you for a lung screening.  Did your provider discuss this with you? Yes   This screening involves an initial meeting with our NP, who is the Nurse Navigator for the program.  It is called a shared decision making visit.  The initial meeting is required by insurance and Medicare to make sure you understand the program.  This appointment takes about 20 minutes to complete.  After you have spoken with the provider, we will schedule you for your screening scan.  This scan takes about 5-10 minutes to complete.  You can eat and drink normally before and after the scan.    I am going to ask you a few questions to make sure you meet the criteria to participate in the program.    Are you a current or former smoker? Current Age began smoking: 68 yo   If you are a former smoker, what year did you quit smoking? NA (must be within 15 yrs)    To calculate your smoking history, I need an accurate estimate of how many packs of cigarettes you smoked per day and   for how many years.    Years smoking 49 x Packs per day 1.25 = Pack years 83   (at least 20 pack yrs)   (Make sure they understand that we need to know how much they have smoked in the past, not just the number of PPD they are smoking now)  Do you have a personal history of cancer?  No    Do you have a family history of cancer? Yes  (cancer type and and relative) Mother/cervical  mat aunt/cervial  Pat uncle/??type  Are you having any  of the following symptoms?  Coughing up blood?  No  Weight loss of 15 lbs or more in the last 6 months without trying / you cannot explain  No  It looks like you meet all criteria.  When would be a good time for us  to schedule you for this screening?   Additional information: N/A

## 2023-01-19 ENCOUNTER — Telehealth: Payer: Self-pay

## 2023-01-19 NOTE — Telephone Encounter (Signed)
 Copied from CRM [id number]. Topic: Appointments - Appointment Scheduling >> Jan 19, 2023  3:24 PM Jorje Guild R wrote: Patient daughter wants Nurse [name] or Gearldine Bienenstock about getting paperwork to get completed.

## 2023-01-19 NOTE — Telephone Encounter (Signed)
 Spoke to daughter.

## 2023-01-25 ENCOUNTER — Other Ambulatory Visit: Payer: Self-pay

## 2023-01-25 MED ORDER — AMLODIPINE BESYLATE 10 MG PO TABS: 10.0000 mg | ORAL_TABLET | Freq: Every day | ORAL | 1 refills | Status: DC

## 2023-01-26 ENCOUNTER — Ambulatory Visit: Payer: Self-pay | Admitting: Internal Medicine

## 2023-01-28 ENCOUNTER — Telehealth: Payer: Self-pay

## 2023-01-28 NOTE — Telephone Encounter (Signed)
Verified patient DPR on file and added FYI to patient chart

## 2023-01-28 NOTE — Telephone Encounter (Signed)
Copied from CRM [id number]. Topic: General - Other >> Jan 28, 2023 10:18 AM Geroge Baseman wrote: Reason for CRM: Tiffany, patients daughter called to relay no one should be able to call and make appointment for her father except her and her father. She would like to use a code word if possible can this be notated under patient flags?? [phone] is the "code" she would like to use for anyone who calls to schedule for this patient.

## 2023-02-02 ENCOUNTER — Ambulatory Visit (INDEPENDENT_AMBULATORY_CARE_PROVIDER_SITE_OTHER): Payer: Medicare HMO | Admitting: Internal Medicine

## 2023-02-02 ENCOUNTER — Ambulatory Visit: Payer: Self-pay | Admitting: Internal Medicine

## 2023-02-02 ENCOUNTER — Encounter: Payer: Self-pay | Admitting: Internal Medicine

## 2023-02-02 VITALS — BP 166/92 | HR 99 | Ht 69.0 in | Wt 161.8 lb

## 2023-02-02 DIAGNOSIS — I10 Essential (primary) hypertension: Secondary | ICD-10-CM | POA: Diagnosis not present

## 2023-02-02 MED ORDER — AMLODIPINE BESYLATE 10 MG PO TABS
10.0000 mg | ORAL_TABLET | Freq: Every day | ORAL | 1 refills | Status: AC
Start: 2023-02-02 — End: 2023-11-23

## 2023-02-02 NOTE — Assessment & Plan Note (Signed)
BP is elevated today.  He is currently prescribed antihypertensive medication regimen consists of amlodipine 10 mg daily and olmesartan 20 mg daily.  Olmesartan was prescribed by Dr. Barbaraann [name] and his most recent appointment in July 2024.  His medication dispense report shows that amlodipine is being filled, 120/25 most recently, but all olmesartan was last filled in July 2024.  When asked what medications he is taking currently, Austin [name] states that he is not taking any medications.  His aid from [facility] states that she has not seen Austin [name] take any medications in the last 6 months. -Amlodipine 10 mg daily refilled today.  Sent to [facility].  We will tentatively plan for follow-up in 4 weeks for BP check and repeat labs.

## 2023-02-02 NOTE — Progress Notes (Signed)
Established Patient Office Visit  Subjective   Patient ID: Austin [name], male    DOB: 09-Aug-1955  Age: 68 y.o. MRN: [id number]  Chief Complaint  Patient presents with   Hypertension    Follow up    [name] returns to care today.  He was last evaluated by Dr. Barbaraann Faster through virtual encounter for HTN follow-up on 07/13/2022.  Olmesartan 20 mg daily was added at that time for improved treatment of hypertension.  6-week follow-up was recommended for BP check and repeat labs.  There have been no acute interval events.  [name] reports feeling well today.  He is accompanied by his home health aide, Chip Boer, from [facility].  [name] does not have any acute concerns today, however Chip Boer states that a code word needs to be established so that the patient's daughter, [name], cannot call to cancel his appointments.  She states that [name] is trying to undermine her father by taking his Social Security checks and food stamps.  Chip Boer states that [name]'s appointment was initially canceled by his daughter because she is attempting to switch his home health services from [facility] to [facility]. [name] does not object to Vicki's concerns but also offers no thoughts of his own.  Past Medical History:  Diagnosis Date   Dyspnea    Hip fracture (HCC)    Hypertension    Stroke [facility])    Past Surgical History:  Procedure Laterality Date   COLONOSCOPY N/A 11/05/2017   Procedure: COLONOSCOPY;  Surgeon: Corbin Ade, MD;  [facility];  Service: Endoscopy;  Laterality: N/A;  12:00   HIP PINNING,CANNULATED Right 07/24/2021   Procedure: CANNULATED HIP PINNING;  Surgeon: Vickki Hearing, MD;  Location: [facility];  Service: Orthopedics;  Laterality: Right;   POLYPECTOMY  11/05/2017   Procedure: POLYPECTOMY;  Surgeon: Corbin Ade, MD;  [facility];  Service: Endoscopy;;  colon   Social History   Tobacco Use   Smoking status: Former    Current packs/day: 2.00    Average packs/day:  2.0 packs/day for 43.0 years (86.0 ttl pk-yrs)    Types: Cigarettes   Smokeless tobacco: Never  Vaping Use   Vaping status: Never Used  Substance Use Topics   Alcohol use: Yes    Comment: beer 2 cans per day   Drug use: Yes    Types: Marijuana   Family History  Problem Relation Age of Onset   Kidney disease Mother    Hypertension Mother    Cancer Mother    Hypertension Father    Atrial fibrillation Father    Heart attack Father    Heart failure Other    Cancer Other    Allergies  Allergen Reactions   Fish Allergy Anaphylaxis   Review of Systems  Constitutional:  Negative for chills and fever.  HENT:  Negative for sore throat.   Respiratory:  Negative for cough and shortness of breath.   Cardiovascular:  Negative for chest pain, palpitations and leg swelling.  Gastrointestinal:  Negative for abdominal pain, blood in stool, constipation, diarrhea, nausea and vomiting.  Genitourinary:  Negative for dysuria and hematuria.  Musculoskeletal:  Negative for myalgias.  Skin:  Negative for itching and rash.  Neurological:  Negative for dizziness and headaches.  Psychiatric/Behavioral:  Negative for depression and suicidal ideas.       Objective:     BP (!) 166/92 (BP Location: Left Arm, Patient Position: Sitting, Cuff Size: Normal)   Pulse  99   Ht 5\' 9"  (1.753 m)   Wt 161 lb 12.8 oz (73.4 kg)   SpO2 95%   BMI 23.89 kg/m  BP Readings from Last 3 Encounters:  02/02/23 (!) 166/92  07/13/22 (!) 147/92  06/10/22 (!) 167/81   Physical Exam Vitals reviewed.  Constitutional:      General: He is not in acute distress.    Appearance: Normal appearance. He is not ill-appearing.  HENT:     Head: Normocephalic and atraumatic.     Right Ear: External ear normal.     Left Ear: External ear normal.     Nose: Nose normal. No congestion or rhinorrhea.     Mouth/Throat:     Mouth: Mucous membranes are moist.     Pharynx: Oropharynx is clear.  Eyes:     General: No scleral  icterus.    Extraocular Movements: Extraocular movements intact.     Conjunctiva/sclera: Conjunctivae normal.     Pupils: Pupils are equal, round, and reactive to light.  Cardiovascular:     Rate and Rhythm: Normal rate and regular rhythm.     Pulses: Normal pulses.     Heart sounds: Normal heart sounds. No murmur heard. Pulmonary:     Effort: Pulmonary effort is normal.     Breath sounds: Normal breath sounds. No wheezing, rhonchi or rales.  Abdominal:     General: Abdomen is flat. Bowel sounds are normal. There is no distension.     Palpations: Abdomen is soft.     Tenderness: There is no abdominal tenderness.  Musculoskeletal:        General: No swelling or deformity. Normal range of motion.     Cervical back: Normal range of motion.  Skin:    General: Skin is warm and dry.     Capillary Refill: Capillary refill takes less than 2 seconds.  Neurological:     General: No focal deficit present.     Mental Status: He is alert.     Motor: No weakness.     Gait: Gait abnormal (Ambulates with walker).     Comments: Oriented to person and place, recognizes current [place] president, able to identify caregiver.  Did not recall the year or month.  Psychiatric:        Mood and Affect: Mood normal.        Behavior: Behavior normal.        Thought Content: Thought content normal.   Last CBC Lab Results  Component Value Date   WBC 10.7 (H) 10/12/2021   HGB 13.8 10/12/2021   HCT 41.6 10/12/2021   MCV 92.2 10/12/2021   MCH 30.6 10/12/2021   RDW 14.2 10/12/2021   PLT 286 10/12/2021   Last metabolic panel Lab Results  Component Value Date   GLUCOSE 67 (L) 06/10/2022   NA 145 (H) 06/10/2022   K 4.5 06/10/2022   CL 104 06/10/2022   CO2 22 06/10/2022   BUN 6 (L) 06/10/2022   CREATININE 1.03 06/10/2022   EGFR 79 06/10/2022   CALCIUM 8.9 06/10/2022   PHOS 3.5 07/23/2021   PROT 7.4 06/10/2022   ALBUMIN 4.6 06/10/2022   LABGLOB 2.8 06/10/2022   AGRATIO 1.6 06/10/2022   BILITOT 0.4  06/10/2022   ALKPHOS 63 06/10/2022   AST 13 06/10/2022   ALT 6 06/10/2022   ANIONGAP 5 10/12/2021   Last lipids Lab Results  Component Value Date   CHOL 152 06/10/2022   HDL 70 06/10/2022   LDLCALC 72 06/10/2022  TRIG 44 06/10/2022   CHOLHDL 2.2 06/10/2022   Last hemoglobin A1c Lab Results  Component Value Date   HGBA1C 5.5 06/10/2022   Last thyroid functions Lab Results  Component Value Date   TSH 2.320 06/10/2022     Assessment & Plan:   Problem List Items Addressed This Visit       Essential hypertension - Primary (Chronic)   BP is elevated today.  He is currently prescribed antihypertensive medication regimen consists of amlodipine 10 mg daily and olmesartan 20 mg daily.  Olmesartan was prescribed by Dr. Barbaraann Faster and his most recent appointment in July 2024.  His medication dispense report shows that amlodipine is being filled, 120/25 most recently, but all olmesartan was last filled in July 2024.  When asked what medications he is taking currently, Mr. Zufall states that he is not taking any medications.  His aid from [facility] states that she has not seen Mr. Mohl take any medications in the last 6 months. -Amlodipine 10 mg daily refilled today.  Sent to [facility].  We will tentatively plan for follow-up in 4 weeks for BP check and repeat labs.      Return in about 4 weeks (around 03/02/2023) for HTN, repeat labs.   Billie Lade, MD

## 2023-02-02 NOTE — Patient Instructions (Signed)
It was a pleasure to see you today.  Thank you for giving [place] the opportunity to be involved in your care.  Below is a brief recap of your visit and next steps.  We will plan to see you again in 4 weeks.  Summary Amlodipine refilled today Follow up in 4 weeks for BP check

## 2023-02-04 ENCOUNTER — Ambulatory Visit (INDEPENDENT_AMBULATORY_CARE_PROVIDER_SITE_OTHER): Payer: Medicare HMO | Admitting: Acute Care

## 2023-02-04 DIAGNOSIS — F17210 Nicotine dependence, cigarettes, uncomplicated: Secondary | ICD-10-CM | POA: Diagnosis not present

## 2023-02-04 NOTE — Progress Notes (Signed)
 Provider Attestation I agree with the documentation of the Shared Decision Making visit,  smoking cessation counseling if appropriate, and verification [name] eligibility for lung cancer screening as documented by the RN Nurse Navigator.   Raejean Bullock, MSN, AGACNP-BC [facility] See Amion for personal pager PCCM on call pager [phone]    Virtual Visit via Telephone Note  I connected with Austin [name] on 02/04/23 at  1:30 PM EST by telephone and verified that I am speaking with the correct person using two identifiers.  Location: Patient: in home Provider: [address], [place], [place], [address] Shared Decision Making Visit Lung Cancer Screening Program [phone]) Spoke with daughter, Austin [name], who is POA  Eligibility: Age 68 y.o. Pack Years Smoking History Calculation 67 (# packs/per year x # years smoked) Recent History of coughing up blood  no Unexplained weight loss? no ( >Than 15 pounds within the last 6 months ) Prior History Lung / other cancer no (Diagnosis within the last 5 years already requiring surveillance chest CT Scans). Smoking Status Current Smoker Former Smokers: Years since quit:  NA  Quit Date: NA  Visit Components: Discussion included one [name] more decision making aids. yes Discussion included risk/benefits of screening. yes Discussion included potential follow up diagnostic testing for abnormal scans. yes Discussion included meaning and risk of over diagnosis. yes Discussion included meaning and risk of False Positives. yes Discussion included meaning of total radiation exposure. yes  Counseling Included: Importance of adherence to annual lung cancer LDCT screening. yes Impact of comorbidities on ability to participate in the program. yes Ability and willingness to under diagnostic treatment. yes  Smoking Cessation Counseling: Current Smokers:  Discussed importance of smoking cessation. yes Information about  tobacco cessation classes and interventions provided to patient. yes Patient provided with "ticket" for LDCT Scan. yes Symptomatic Patient. no  Counseling NA Diagnosis Code: Tobacco Use Z72.0 Asymptomatic Patient yes  Counseling (Intermediate counseling: > three minutes counseling) G2952 Former Smokers:  Discussed the importance of maintaining cigarette abstinence. yes Diagnosis Code: Personal History of Nicotine  Dependence. W41.324 Information about tobacco cessation classes and interventions provided to patient. Yes Patient provided with "ticket" for LDCT Scan. yes Written Order for Lung Cancer Screening with LDCT placed in Epic. Yes (CT Chest Lung Cancer Screening Low Dose W/O CM) [id number] Z12.2-Screening of respiratory organs Z87.891-Personal history of nicotine  dependence   Valentin Gaskins, RN 02/04/23

## 2023-02-04 NOTE — Patient Instructions (Signed)

## 2023-02-12 ENCOUNTER — Ambulatory Visit: Payer: Medicare HMO | Admitting: Internal Medicine

## 2023-02-18 ENCOUNTER — Ambulatory Visit (HOSPITAL_COMMUNITY)
Admission: RE | Admit: 2023-02-18 | Discharge: 2023-02-18 | Disposition: A | Discharge status: Home / Self Care | Payer: Medicare HMO | Source: Ambulatory Visit | Attending: Acute Care | Admitting: Acute Care

## 2023-02-18 ENCOUNTER — Telehealth: Payer: Medicare HMO | Admitting: Internal Medicine

## 2023-02-18 ENCOUNTER — Telehealth: Payer: Self-pay

## 2023-02-18 DIAGNOSIS — Z87891 Personal history of nicotine dependence: Secondary | ICD-10-CM | POA: Insufficient documentation

## 2023-02-18 DIAGNOSIS — Z122 Encounter for screening for malignant neoplasm of respiratory organs: Secondary | ICD-10-CM | POA: Diagnosis not present

## 2023-02-18 DIAGNOSIS — F17210 Nicotine dependence, cigarettes, uncomplicated: Secondary | ICD-10-CM | POA: Diagnosis not present

## 2023-02-18 NOTE — Telephone Encounter (Signed)
Copied from CRM [id number]. Topic: General - Other >> Feb 18, 2023 11:38 AM Austin [name] wrote: Reason for CRM: Patients daughter calling to check in on the status of his forms that are being filled out by the nurse. Please call to advise the status of the forms and also would like to know if he needs to be scheduled for a physical.

## 2023-02-19 NOTE — Telephone Encounter (Signed)
Provider has declared patient medically competent to make medical decisions for self. Patient's wishes supercede POA. Staff will honor patient request.

## 2023-02-19 NOTE — Telephone Encounter (Signed)
Vickie advised if patient wants to change adult services, to bring forms by

## 2023-03-09 ENCOUNTER — Telehealth: Payer: Self-pay | Admitting: Internal Medicine

## 2023-03-09 ENCOUNTER — Telehealth: Payer: Self-pay | Admitting: Acute Care

## 2023-03-09 ENCOUNTER — Telehealth: Payer: Self-pay

## 2023-03-09 DIAGNOSIS — M899 Disorder of bone, unspecified: Secondary | ICD-10-CM

## 2023-03-09 NOTE — Telephone Encounter (Signed)
 Copied from CRM [id number]. Topic: Appointments - Appointment Scheduling >> Mar 05, 2023 10:53 AM Alessandra Bevels wrote: Contacted patient representative is calling to schedule an appointment Patients daughter is interested in scheduling for March 18, 2023 that date is not available. When patient's daughter calls back please offer an alternative date.

## 2023-03-09 NOTE — Telephone Encounter (Signed)
 Copied from CRM [id number]. Topic: Appointments - Appointment Scheduling >> Mar 05, 2023  2:02 PM Keya C wrote: Watching schedule for available appt on 3/13

## 2023-03-09 NOTE — Telephone Encounter (Signed)
 I have called the patient's daughter, Austin [name] who is listed as his DPR, with the results of his low-dose screening scan.  His scan was read as a lung RADS 10-month follow-up low-dose screening CT.  This will be due mid February 2026.  Please fax results to PCP in order 94-month annual screening scan.  There was an incidental finding of a lytic lesion age-indeterminate pathological fracture of the manubrium which is worrisome for multiple myeloma.  I explained this incidental finding to the patient's daughter Austin [name]. I told her that I was going to refer him back to his primary care Austin [name] for evaluation for multiple myeloma.  Generally it starts with a CBC which he has not had since 2023, and a serum protein electrophoresis.  These will be followed by an MRI if they were abnormal or concerning for multiple myeloma.  I will include Austin [name] on this telephone note in addition I will call his office and let him know about this incidental finding.  Austin [name], please follow-up as you feel is clinically indicated however this was a concerning incidental finding on his screening scan. Please do not hesitate to reach out if you have any questions or concerns  Thank you so much  I have called Austin [name] office and spoken with Austin [name] regarding the incidental finding of a lytic lesion and age indeterminate pathologic fracture of the manubrium, which is worrisome for multiple myeloma per radiology.   I then spoke with Austin [name]. In triage. She was able to locate the imaging in EPIC . I told her I have spoken with Austin [name], the patient's daughter, and that she is aware of the finding and that she needs to be the point of contact for appointments for follow up.   Nothing further was needed.

## 2023-03-09 NOTE — Telephone Encounter (Signed)
 Patient had Lung Cancer screening- normal screening with incidental finding. Concerning for multiple myleoma  Report in chart: IMPRESSION: 1. Lung-RADS 1, negative. Continue annual screening with low-dose chest CT without contrast in 12 months. 2. Lytic lesion and age-indeterminate pathologic fracture of the manubrium, worrisome for multiple myeloma. These results will be called to the ordering clinician or representative by the Radiologist Assistant, and communication documented in the PACS or [employer]. 3. Trace pericardial effusion. 4.  Emphysema (ICD10-J43.9).  Patient's daughter-Tiffany has been notified and has requested that any referrals/follow up be routed through her.  Marliss Czar Daughter Emergency Contact [phone]

## 2023-03-09 NOTE — Telephone Encounter (Signed)
 Received call report:  IMPRESSION: 1. Lung-RADS 1, negative. Continue annual screening with low-dose chest CT without contrast in 12 months. 2. Lytic lesion and age-indeterminate pathologic fracture of the manubrium, worrisome for multiple myeloma. These results will be called to the ordering clinician or representative by the Radiologist Assistant, and communication documented in the PACS or [employer]. 3. Trace pericardial effusion. 4.  Emphysema (ICD10-J43.9).

## 2023-03-09 NOTE — Telephone Encounter (Signed)
 Call Report from Tiffany  IMPRESSION: 1. Lung-RADS 1, negative. Continue annual screening with low-dose chest CT without contrast in 12 months. 2. Lytic lesion and age-indeterminate pathologic fracture of the manubrium, worrisome for multiple myeloma. These results will be called to the ordering clinician or representative by the Radiologist Assistant, and communication documented in the PACS or [employer]. 3. Trace pericardial effusion. 4.  Emphysema (ICD10-J43.9).

## 2023-03-10 NOTE — Telephone Encounter (Signed)
Results sent to PCP

## 2023-03-12 ENCOUNTER — Ambulatory Visit: Payer: Medicare HMO | Admitting: Internal Medicine

## 2023-03-17 ENCOUNTER — Telehealth: Payer: Self-pay

## 2023-03-17 NOTE — Telephone Encounter (Signed)
 Returned VM. No answer. LVM to call office with any questions.

## 2023-03-18 ENCOUNTER — Telehealth: Payer: Self-pay | Admitting: Acute Care

## 2023-03-18 ENCOUNTER — Other Ambulatory Visit: Payer: Self-pay | Admitting: Acute Care

## 2023-03-18 ENCOUNTER — Ambulatory Visit: Payer: Medicare HMO | Admitting: Family Medicine

## 2023-03-18 ENCOUNTER — Ambulatory Visit: Admitting: Internal Medicine

## 2023-03-18 ENCOUNTER — Telehealth: Payer: Self-pay

## 2023-03-18 DIAGNOSIS — M899 Disorder of bone, unspecified: Secondary | ICD-10-CM

## 2023-03-18 DIAGNOSIS — M898X9 Other specified disorders of bone, unspecified site: Secondary | ICD-10-CM

## 2023-03-18 NOTE — Progress Notes (Signed)
See addended note.

## 2023-03-18 NOTE — Telephone Encounter (Signed)
 Noted, thank you Maralyn Sago.

## 2023-03-18 NOTE — Telephone Encounter (Signed)
 I have called the patient's daughter Austin [name] and explained to her that I have made a referral to oncology hematology for the patient to be seen by Dr.Kale. I wanted to make sure she was aware to be looking for the phone call to get the patient scheduled as soon as possible. I have also called Dr.Kale's office to see if there are any labs or imaging that I can order so that he has been to review at the initial consultation.  I have left my cell phone number and in the event he warrants additional things ordered I will place the orders.

## 2023-03-18 NOTE — Progress Notes (Signed)
 Referral placed to oncology/ hematology, Dr. Candise Che

## 2023-03-18 NOTE — Telephone Encounter (Signed)
 Returned call. Daughter advises patient's f/u appt to discuss further work up for myeloma was cancelled by Dr. Darletta Moll office and she was told to call our office back for f/u. Alexandria Lodge, NP aware and will refer patient to the appropriate speciality for follow up. Advised patient we are unsure why PCP f/u appt was cancelled. Advised patient's daughter we will call her back later today with an update on follow up plan moving forward.

## 2023-03-19 NOTE — Progress Notes (Signed)
 Called and spoke to patients daughter [name]. Informed her that we had received a referral for her father and would like to schedule and appointment. Per Tiffany Mr Boody has a PET scheduled on 03/25/23 at 0830 at [facility] and would not be able to make a 9 or 10:00 appointment with Dr Candise Che. Daughter asked if she could call back after checking her work schedule. I gave [name] our number so that she could call at a later time and make an appointment that will be convenient to her work schedule. Will continue to monitor for needs.

## 2023-03-25 ENCOUNTER — Encounter (HOSPITAL_COMMUNITY)
Admission: RE | Admit: 2023-03-25 | Discharge: 2023-03-25 | Disposition: A | Discharge status: Home / Self Care | Source: Ambulatory Visit | Attending: Acute Care | Admitting: Acute Care

## 2023-03-25 DIAGNOSIS — R918 Other nonspecific abnormal finding of lung field: Secondary | ICD-10-CM | POA: Insufficient documentation

## 2023-03-25 DIAGNOSIS — M899 Disorder of bone, unspecified: Secondary | ICD-10-CM | POA: Diagnosis not present

## 2023-03-25 DIAGNOSIS — I700 Atherosclerosis of aorta: Secondary | ICD-10-CM | POA: Diagnosis not present

## 2023-03-25 DIAGNOSIS — J439 Emphysema, unspecified: Secondary | ICD-10-CM | POA: Diagnosis not present

## 2023-03-25 MED ORDER — FLUDEOXYGLUCOSE F - 18 (FDG) INJECTION
8.3400 | Freq: Once | INTRAVENOUS | Status: AC | PRN
  Administered 2023-03-25: 8.34 via INTRAVENOUS

## 2023-03-30 ENCOUNTER — Telehealth: Payer: Self-pay | Admitting: Internal Medicine

## 2023-03-30 ENCOUNTER — Inpatient Hospital Stay: Attending: Hematology | Admitting: Hematology

## 2023-03-30 ENCOUNTER — Inpatient Hospital Stay

## 2023-03-30 VITALS — BP 143/76 | HR 85 | Temp 98.2°F | Resp 15 | Wt 154.3 lb

## 2023-03-30 DIAGNOSIS — Z87891 Personal history of nicotine dependence: Secondary | ICD-10-CM | POA: Insufficient documentation

## 2023-03-30 DIAGNOSIS — E559 Vitamin D deficiency, unspecified: Secondary | ICD-10-CM | POA: Insufficient documentation

## 2023-03-30 DIAGNOSIS — F0392 Unspecified dementia, unspecified severity, with psychotic disturbance: Secondary | ICD-10-CM | POA: Diagnosis not present

## 2023-03-30 DIAGNOSIS — M899 Disorder of bone, unspecified: Secondary | ICD-10-CM | POA: Insufficient documentation

## 2023-03-30 DIAGNOSIS — I10 Essential (primary) hypertension: Secondary | ICD-10-CM | POA: Insufficient documentation

## 2023-03-30 DIAGNOSIS — J439 Emphysema, unspecified: Secondary | ICD-10-CM | POA: Insufficient documentation

## 2023-03-30 DIAGNOSIS — R790 Abnormal level of blood mineral: Secondary | ICD-10-CM | POA: Insufficient documentation

## 2023-03-30 DIAGNOSIS — Z79899 Other long term (current) drug therapy: Secondary | ICD-10-CM | POA: Insufficient documentation

## 2023-03-30 LAB — CMP (CANCER CENTER ONLY)
ALT: 6 U/L (ref 0–44)
AST: 12 U/L — ABNORMAL LOW (ref 15–41)
Albumin: 4.2 g/dL (ref 3.5–5.0)
Alkaline Phosphatase: 56 U/L (ref 38–126)
Anion gap: 6 (ref 5–15)
BUN: 15 mg/dL (ref 8–23)
CO2: 28 mmol/L (ref 22–32)
Calcium: 9.1 mg/dL (ref 8.9–10.3)
Chloride: 110 mmol/L (ref 98–111)
Creatinine: 1.02 mg/dL (ref 0.61–1.24)
GFR, Estimated: >60 mL/min (ref >60)
Glucose, Bld: 92 mg/dL (ref 70–99)
Potassium: 3.9 mmol/L (ref 3.5–5.1)
Sodium: 144 mmol/L (ref 135–145)
Total Bilirubin: 0.4 mg/dL (ref 0.0–1.2)
Total Protein: 7.5 g/dL (ref 6.5–8.1)

## 2023-03-30 LAB — CBC WITH DIFFERENTIAL (CANCER CENTER ONLY)
Abs Immature Granulocytes: 0.03 10*3/uL (ref 0.00–0.07)
Basophils Absolute: 0.1 10*3/uL (ref 0.0–0.1)
Basophils Relative: 1 %
Eosinophils Absolute: 0.3 10*3/uL (ref 0.0–0.5)
Eosinophils Relative: 2 %
HCT: 41 % (ref 39.0–52.0)
Hemoglobin: 13.5 g/dL (ref 13.0–17.0)
Immature Granulocytes: 0 %
Lymphocytes Relative: 35 %
Lymphs Abs: 4.2 10*3/uL — ABNORMAL HIGH (ref 0.7–4.0)
MCH: 29.4 pg (ref 26.0–34.0)
MCHC: 32.9 g/dL (ref 30.0–36.0)
MCV: 89.3 fL (ref 80.0–100.0)
Monocytes Absolute: 1 10*3/uL (ref 0.1–1.0)
Monocytes Relative: 8 %
Neutro Abs: 6.4 10*3/uL (ref 1.7–7.7)
Neutrophils Relative %: 54 %
Platelet Count: 331 10*3/uL (ref 150–400)
RBC: 4.59 MIL/uL (ref 4.22–5.81)
RDW: 13.7 % (ref 11.5–15.5)
WBC Count: 11.9 10*3/uL — ABNORMAL HIGH (ref 4.0–10.5)
nRBC: 0 % (ref 0.0–0.2)

## 2023-03-30 LAB — IRON AND IRON BINDING CAPACITY (CC-WL,HP ONLY)
Iron: 49 ug/dL (ref 45–182)
Saturation Ratios: 16 % — ABNORMAL LOW (ref 17.9–39.5)
TIBC: 298 ug/dL (ref 250–450)
UIBC: 249 ug/dL (ref 117–376)

## 2023-03-30 LAB — VITAMIN D 25 HYDROXY (VIT D DEFICIENCY, FRACTURES): Vit D, 25-Hydroxy: 8.05 ng/mL — ABNORMAL LOW (ref 30–100)

## 2023-03-30 LAB — FERRITIN: Ferritin: 123 ng/mL (ref 24–336)

## 2023-03-30 LAB — SEDIMENTATION RATE: Sed Rate: 15 mm/h (ref 0–16)

## 2023-03-30 LAB — VITAMIN B12: Vitamin B-12: 113 pg/mL — ABNORMAL LOW (ref 180–914)

## 2023-03-30 NOTE — Telephone Encounter (Signed)
 Patient daughter here, Austin [name], she is wanting a nurse to call her asap to see what she needs to do in order to get written recommendation for her to become her father's guardian. Nothing available on schedule and pt is needing this soon. Please speak with patient so she can further explain Thank you

## 2023-03-30 NOTE — Progress Notes (Signed)
 HEMATOLOGY/ONCOLOGY CONSULTATION NOTE  Date of Service: 03/30/2023  Patient Care Team: Billie Lade, MD as PCP - General (Internal Medicine) West Bali, MD (Inactive) as Consulting Physician (Gastroenterology)  CHIEF COMPLAINTS/PURPOSE OF CONSULTATION:  Lytic bone lesions on Ct scan.   HISTORY OF PRESENTING ILLNESS:   Austin [name] is a wonderful 68 y.o. male who has been referred to [place] by NP Kandice Robinsons for evaluation and management of lytic bone lesions on CT scan.   CT Chest Lung CA Screen Low dose W/O CM from 02/18/2023 showed Lytic lesion and age-indeterminate pathologic fracture of the manubrium, worrisome for multiple myeloma.  Patient is accompanied by his daughter during this visit. Patient notes he has been doing well overall without any new or severe medical concerns in the past 77-months. He denies any bone pain, chest pain, or joint pain in the past 36-months. He denies any recent falls or injuries to the chest wall.   He did have a fall in 2014, where he had hip surgery.   He denies any recent infection issues, fever, chills, night sweats, unexpected weight loss, back pain, abdominal pain, new lump/bumps, or leg swelling.   He does complain of balance issues. He uses walking stick and wheel-chair.   Patient currently lives independently and he has a home caretaker. His daughter notes that the current nursing aid is at the patient's house for 6-7 hours. His current home-care facility is [facility]. Patient's daughter is currently in the process to change the home-care facility.   He denies diabetes or lung-disease.  PmHx of COPD, emphysema, hypertension, pericardial effusion, and alcohol-induced dementia. Patient's daughter reports that the patient occasionally has hallucinations due to alcohol-induced dementia. He is currently not getting treated for pericardial effusion.   He currently smokes 2 packs of cigarettes a day. He used to be a heavy drinker -  around 6-12 packs of beer a day (around 4 years ago). Patient's daughter notes he now occasionally drinks 1-2 beer in a month. He does have past history of drug abuse.   Patient did have a PET scan on 03/25/2023, but still waiting on results.   MEDICAL HISTORY:  Past Medical History:  Diagnosis Date   Dyspnea    Hip fracture (HCC)    Hypertension    Stroke [facility])     SURGICAL HISTORY: Past Surgical History:  Procedure Laterality Date   COLONOSCOPY N/A 11/05/2017   Procedure: COLONOSCOPY;  Surgeon: Corbin Ade, MD;  Location: [facility];  Service: Endoscopy;  Laterality: N/A;  12:00   HIP PINNING,CANNULATED Right 07/24/2021   Procedure: CANNULATED HIP PINNING;  Surgeon: Vickki Hearing, MD;  Location: [facility] ORS;  Service: Orthopedics;  Laterality: Right;   POLYPECTOMY  11/05/2017   Procedure: POLYPECTOMY;  Surgeon: Corbin Ade, MD;  Location: [facility];  Service: Endoscopy;;  colon    SOCIAL HISTORY: Social History   Socioeconomic History   Marital status: Legally Separated    Spouse name: Not on file   Number of children: Not on file   Years of education: Not on file   Highest education level: Not on file  Occupational History   Not on file  Tobacco Use   Smoking status: Former    Current packs/day: 2.00    Average packs/day: 2.0 packs/day for 43.0 years (86.0 ttl pk-yrs)    Types: Cigarettes   Smokeless tobacco: Never  Vaping Use   Vaping status: Never Used  Substance and Sexual Activity  Alcohol use: Yes    Comment: beer 2 cans per day   Drug use: Yes    Types: Marijuana   Sexual activity: Yes    Birth control/protection: None  Other Topics Concern   Not on file  Social History Narrative   Not on file   Social Drivers of Health   Financial Resource Strain: Low Risk  (11/19/2022)   Overall Financial Resource Strain (CARDIA)    Difficulty of Paying Living Expenses: Not hard at all  Food Insecurity: No Food Insecurity (11/19/2022)   Hunger  Vital Sign    Worried About Running Out of Food in the Last Year: Never true    Ran Out of Food in the Last Year: Never true  Transportation Needs: No Transportation Needs (11/19/2022)   PRAPARE - Administrator, Civil Service (Medical): No    Lack of Transportation (Non-Medical): No  Physical Activity: Inactive (11/19/2022)   Exercise Vital Sign    Days of Exercise per Week: 0 days    Minutes of Exercise per Session: 0 min  Stress: No Stress Concern Present (11/19/2022)   [employer] of Occupational Health - Occupational Stress Questionnaire    Feeling of Stress : Not at all  Social Connections: Socially Isolated (11/19/2022)   Social Connection and Isolation Panel [NHANES]    Frequency of Communication with Friends and Family: More than three times a week    Frequency of Social Gatherings with Friends and Family: More than three times a week    Attends Religious Services: Never    Database administrator or Organizations: No    Attends Banker Meetings: Never    Marital Status: Divorced  Catering manager Violence: Not At Risk (11/19/2022)   Humiliation, Afraid, Rape, and Kick questionnaire    Fear of Current or Ex-Partner: No    Emotionally Abused: No    Physically Abused: No    Sexually Abused: No    FAMILY HISTORY: Family History  Problem Relation Age of Onset   Kidney disease Mother    Hypertension Mother    Cancer Mother    Hypertension Father    Atrial fibrillation Father    Heart attack Father    Heart failure Other    Cancer Other     ALLERGIES:  is allergic to fish allergy.  MEDICATIONS:  Current Outpatient Medications  Medication Sig Dispense Refill   amLODipine (NORVASC) 10 MG tablet Take 1 tablet (10 mg total) by mouth daily. 90 tablet 1   olmesartan (BENICAR) 20 MG tablet Take 1 tablet (20 mg total) by mouth daily. 60 tablet 0   No current facility-administered medications for this visit.    REVIEW OF SYSTEMS:    10  Point review of Systems was done is negative except as noted above.  PHYSICAL EXAMINATION: ECOG PERFORMANCE STATUS: 1 - Symptomatic but completely ambulatory  .There were no vitals filed for this visit. There were no vitals filed for this visit. .There is no height or weight on file to calculate BMI.  GENERAL:alert, in no acute distress and comfortable SKIN: no acute rashes, no significant lesions EYES: conjunctiva are pink and non-injected, sclera anicteric OROPHARYNX: MMM, no exudates, no oropharyngeal erythema or ulceration NECK: supple, no JVD LYMPH:  no palpable lymphadenopathy in the cervical, axillary or inguinal regions LUNGS: clear to auscultation b/l with normal respiratory effort HEART: regular rate & rhythm ABDOMEN:  normoactive bowel sounds , non tender, not distended. Extremity: no pedal edema PSYCH: alert &  oriented x 3 with fluent speech NEURO: no focal motor/sensory deficits  LABORATORY DATA:  I have reviewed the data as listed  .    Latest Ref Rng & Units 10/12/2021   11:01 AM 07/28/2021    5:42 AM 07/27/2021    5:51 AM  CBC  WBC 4.0 - 10.5 K/uL 10.7  9.5  10.5   Hemoglobin 13.0 - 17.0 g/dL 84.1  32.4  40.1   Hematocrit 39.0 - 52.0 % 41.6  38.3  39.4   Platelets 150 - 400 K/uL 286  292  269     .    Latest Ref Rng & Units 06/10/2022    5:00 PM 10/12/2021   11:01 AM 07/28/2021    5:42 AM  CMP  Glucose 70 - 99 mg/dL 67  78  95   BUN 8 - 27 mg/dL 6  13  7    Creatinine 0.76 - 1.27 mg/dL 0.27  2.53  6.64   Sodium 134 - 144 mmol/L 145  144  139   Potassium 3.5 - 5.2 mmol/L 4.5  3.1  3.1   Chloride 96 - 106 mmol/L 104  109  103   CO2 20 - 29 mmol/L 22  30  31    Calcium 8.6 - 10.2 mg/dL 8.9  8.8  8.3   Total Protein 6.0 - 8.5 g/dL 7.4     Total Bilirubin 0.0 - 1.2 mg/dL 0.4     Alkaline Phos 44 - 121 IU/L 63     AST 0 - 40 IU/L 13     ALT 0 - 44 IU/L 6        RADIOGRAPHIC STUDIES: I have personally reviewed the radiological images as listed and agreed  with the findings in the report. No results found.  ASSESSMENT & PLAN:   Lytic lesion on CT scan.  PET scan pending Additional labs after this visit with PSA levels.   PLAN: -Discussed the reason for today's visit regarding the lytic lesion as seen on CT Lung scan.   -Discussed the next plan is to get additional lab-workup with PSA level after this visit and to wait on PET scan result .  -Discussed with the patient that if there is a tumor shown on PET scan, we might get a biopsy.  -Discussed the option of phone visit or in-person visit to discuss lab-workup results and PET scan results. Patient's daughter wants to proceed with tele-med visit.  -Answered all of patient's questions.   . Orders Placed This Encounter  Procedures   CBC with Differential (Cancer Center Only)    Standing Status:   Future    Number of Occurrences:   1    Expiration Date:   03/29/2024   CMP (Cancer Center only)    Standing Status:   Future    Number of Occurrences:   1    Expected Date:   03/30/2023    Expiration Date:   03/29/2024   Multiple Myeloma Panel (SPEP&IFE w/QIG)    Standing Status:   Future    Number of Occurrences:   1    Expected Date:   03/30/2023    Expiration Date:   03/29/2024   Kappa/lambda light chains    Standing Status:   Future    Number of Occurrences:   1    Expected Date:   03/30/2023    Expiration Date:   03/29/2024   Sedimentation rate    Standing Status:   Future    Number  of Occurrences:   1    Expected Date:   03/30/2023    Expiration Date:   03/29/2024   Beta 2 microglobulin, serum    Standing Status:   Future    Number of Occurrences:   1    Expected Date:   03/30/2023    Expiration Date:   03/29/2024   PSA, total and free    Standing Status:   Future    Number of Occurrences:   1    Expiration Date:   03/29/2024   Vitamin D 25 hydroxy    Standing Status:   Future    Number of Occurrences:   1    Expected Date:   03/30/2023    Expiration Date:   03/29/2024   Vitamin  B12    Standing Status:   Future    Number of Occurrences:   1    Expected Date:   03/30/2023    Expiration Date:   03/29/2024   Ferritin    Standing Status:   Future    Number of Occurrences:   1    Expected Date:   03/30/2023    Expiration Date:   03/29/2024   Iron and Iron Binding Capacity ([facility] only)    Standing Status:   Future    Number of Occurrences:   1    Expiration Date:   03/29/2024    FOLLOW-UP: Labs today Phone visit with dr Candise Che in 7-12 days  .The total time spent in the appointment was 45 minutes* .  All of the patient's questions were answered with apparent satisfaction. The patient knows to call the clinic with any problems, questions or concerns.   Wyvonnia Lora MD MS AAHIVMS [facility] Hematology/Oncology Physician [facility]  .*Total Encounter Time as defined by the Centers for Medicare and Medicaid Services includes, in addition to the face-to-face time of a patient visit (documented in the note above) non-face-to-face time: obtaining and reviewing outside history, ordering and reviewing medications, tests or procedures, care coordination (communications with other health care professionals or caregivers) and documentation in the medical record.   03/30/2023 9:26 AM   I,Param Shah,acting as a scribe for Wyvonnia Lora, MD.,have documented all relevant documentation on the behalf of Wyvonnia Lora, MD,as directed by  Wyvonnia Lora, MD while in the presence of Wyvonnia Lora, MD.  .I have reviewed the above documentation for accuracy and completeness, and I agree with the above. Johney Maine MD

## 2023-03-31 LAB — KAPPA/LAMBDA LIGHT CHAINS
Kappa free light chain: 146.6 mg/L — ABNORMAL HIGH (ref 3.3–19.4)
Kappa, lambda light chain ratio: 6.82 — ABNORMAL HIGH (ref 0.26–1.65)
Lambda free light chains: 21.5 mg/L (ref 5.7–26.3)

## 2023-03-31 LAB — BETA 2 MICROGLOBULIN, SERUM: Beta-2 Microglobulin: 2 mg/L (ref 0.6–2.4)

## 2023-04-01 ENCOUNTER — Telehealth: Payer: Self-pay | Admitting: Hematology

## 2023-04-01 LAB — MULTIPLE MYELOMA PANEL, SERUM
Albumin SerPl Elph-Mcnc: 3.8 g/dL (ref 2.9–4.4)
Albumin/Glob SerPl: 1.2 (ref 0.7–1.7)
Alpha 1: 0.2 g/dL (ref 0.0–0.4)
Alpha2 Glob SerPl Elph-Mcnc: 0.5 g/dL (ref 0.4–1.0)
B-Globulin SerPl Elph-Mcnc: 1.1 g/dL (ref 0.7–1.3)
Gamma Glob SerPl Elph-Mcnc: 1.5 g/dL (ref 0.4–1.8)
Globulin, Total: 3.4 g/dL (ref 2.2–3.9)
IgA: 278 mg/dL (ref 61–437)
IgG (Immunoglobin G), Serum: 1392 mg/dL (ref 603–1613)
IgM (Immunoglobulin M), Srm: 218 mg/dL — ABNORMAL HIGH (ref 20–172)
Total Protein ELP: 7.2 g/dL (ref 6.0–8.5)

## 2023-04-01 LAB — PSA, TOTAL AND FREE
PSA, Free Pct: 31.3 %
PSA, Free: 0.72 ng/mL
Prostate Specific Ag, Serum: 2.3 ng/mL (ref 0.0–4.0)

## 2023-04-01 NOTE — Telephone Encounter (Signed)
 Left patient a vm regarding upcoming appointment

## 2023-04-02 NOTE — Telephone Encounter (Signed)
 Patient and daughter may require a consultation to determine patient need for a guardian.

## 2023-04-05 NOTE — Telephone Encounter (Signed)
 Appt will be scheduled for 4/3

## 2023-04-07 ENCOUNTER — Telehealth: Payer: Self-pay

## 2023-04-07 NOTE — Telephone Encounter (Signed)
 Patient has appt with PCP tomorrow.   Called patient's daughter to request medical records from other offices that would be beneficial to tomorrow's visit in addition to POA paperwork so office can scan documents to patient's chart.

## 2023-04-08 ENCOUNTER — Encounter: Payer: Self-pay | Admitting: Internal Medicine

## 2023-04-08 ENCOUNTER — Ambulatory Visit (INDEPENDENT_AMBULATORY_CARE_PROVIDER_SITE_OTHER): Admitting: Internal Medicine

## 2023-04-08 VITALS — BP 111/74 | HR 96 | Ht 69.0 in | Wt 157.0 lb

## 2023-04-08 DIAGNOSIS — Z7189 Other specified counseling: Secondary | ICD-10-CM | POA: Insufficient documentation

## 2023-04-08 DIAGNOSIS — F1027 Alcohol dependence with alcohol-induced persisting dementia: Secondary | ICD-10-CM | POA: Diagnosis not present

## 2023-04-08 NOTE — Patient Instructions (Signed)
 It was a pleasure to see you today.  Thank you for giving [place] the opportunity to be involved in your care.  Below is a brief recap of your visit and next steps.  We will plan to see you again in 3 months.  Summary Social work referral entered Follow up in 3 months

## 2023-04-08 NOTE — Progress Notes (Signed)
 Acute Office Visit  Subjective:     Patient ID: Austin [name], male    DOB: 03-20-1955, 68 y.o.   MRN: [id number]  Chief Complaint  Patient presents with   guardianship    Patient daughter is wanting guardianship over her father   Mr. Woolsey presents today for an acute visit accompanied by his daughter, [name] to discuss guardianship.  Tiffany says that she is pursuing guardianship for her father because she feels that he cannot make decisions for himself and is concerned that he is being taken advantage of.  Mr. Platner currently lives alone but has daily home health services through [facility].  An aide is with him 6-7 hours of the day.  He does not prepare meals or clean.  He administers his medications by himself with reminders from the home health aide.  Tiffany says that his home health aide has been signing documents on his behalf and that Mr. Antigua has no idea what these documents are for.  [name] is concerned that her father is being taken advantage of.  She is attempting to switch his home health services to a different agency, noting that "it is personal" at this point.  Mr. Lepage acknowledges that documents have been signed on his behalf by a home health aide.  He further states that he would like for Tiffany to make decisions on his behalf in the event that he is incapable of doing so.  Tiffany reports that a guardianship hearing is currently scheduled for 4/8 in [place]. Of note, [name] has also stated on multiple occasions that she is Mr. Erhart's healthcare power of attorney, however I cannot find any supporting documents in the patient's medical record.  She says that she has the document on her phone but does not have a hardcopy currently.     Objective:    BP 111/74 (BP Location: Left Arm, Patient Position: Sitting, Cuff Size: Normal)   Pulse 96   Ht 5\' 9"  (1.753 m)   Wt 157 lb (71.2 kg)   SpO2 95%   BMI 23.18 kg/m   Physical Exam Vitals reviewed.   Constitutional:      General: He is not in acute distress.    Appearance: Normal appearance. He is not ill-appearing.     Comments: Examined in wheelchair  HENT:     Head: Normocephalic and atraumatic.     Right Ear: External ear normal.     Left Ear: External ear normal.     Nose: Nose normal. No congestion or rhinorrhea.     Mouth/Throat:     Mouth: Mucous membranes are moist.     Pharynx: Oropharynx is clear.  Eyes:     General: No scleral icterus.    Extraocular Movements: Extraocular movements intact.     Conjunctiva/sclera: Conjunctivae normal.     Pupils: Pupils are equal, round, and reactive to light.  Cardiovascular:     Rate and Rhythm: Normal rate and regular rhythm.     Pulses: Normal pulses.     Heart sounds: Normal heart sounds. No murmur heard. Pulmonary:     Effort: Pulmonary effort is normal.     Breath sounds: Normal breath sounds. No wheezing, rhonchi or rales.  Abdominal:     General: Abdomen is flat. Bowel sounds are normal. There is no distension.     Palpations: Abdomen is soft.     Tenderness: There is no abdominal tenderness.  Musculoskeletal:        General: No  swelling or deformity. Normal range of motion.     Cervical back: Normal range of motion.  Skin:    General: Skin is warm and dry.     Capillary Refill: Capillary refill takes less than 2 seconds.  Neurological:     General: No focal deficit present.     Mental Status: He is alert.     Motor: No weakness.     Comments: He is oriented to person and place.  Cannot recall the year, month, or date.  He is unaware of the circumstances of today's appointment.  Psychiatric:        Mood and Affect: Mood normal.        Behavior: Behavior normal.        Thought Content: Thought content normal.       Assessment & Plan:   Problem List Items Addressed This Visit       Alcohol-induced persisting dementia (HCC) - Primary   Previously documented history of alcohol induced persisting dementia.   MoCA assessment administered today.  Patient scored 18/30, consistent with mild cognitive impairment. He is oriented to person and place but does not know the year or date.  He scored poorly on delayed recall.  He does appear to have some degree of insight into his current circumstances, though he is unsure about today's appointment.  He acknowledges that he has a home health aide and receives Meals on Wheels.  He administers his medications daily with a reminder from his home health aide.  He states that he would like his daughter, [name], to make decisions on his behalf in the event that he is unable to do so.         Advanced care planning/counseling discussion   Mr. Spielmann presents today with his daughter for an acute visit to discuss guardianship as described above.  The patient's daughter, [name], states that she is her father's healthcare power of attorney but I have not found to be supporting documents in the patient's chart.  She says that she has a copy on her phone and will forward a copy to our office so that it can be uploaded to his medical record.  Her chief concern today is wanting to establish guardianship because she is concerned that her father is being taken advantage of as described above.  She says that his home health agency has been signing documents on his behalf without the patient's knowing.  No specific examples were provided, however the patient acknowledges that documents have been signed with his signature by his home health aide. -Social work referral placed today for assistance with navigating these complicated circumstances -I have asked the patient's daughter to provide documentation supporting her claim as the patient's HCPOA      Return in about 3 months (around 07/08/2023).  Billie Lade, MD

## 2023-04-08 NOTE — Progress Notes (Signed)
 HEMATOLOGY/ONCOLOGY PHONE VISIT NOTE  Date of Service: 04/09/2023  Patient Care Team: Billie Lade, MD as PCP - General (Internal Medicine) [place] Bali, MD (Inactive) as Consulting Physician (Gastroenterology)  CHIEF COMPLAINTS/PURPOSE OF CONSULTATION:  Lytic bone lesions on Ct scan.   HISTORY OF PRESENTING ILLNESS:   Austin [name] is a wonderful 68 y.o. male who has been referred to [place] by NP Kandice Robinsons for evaluation and management of lytic bone lesions on CT scan.   CT Chest Lung CA Screen Low dose W/O CM from 02/18/2023 showed Lytic lesion and age-indeterminate pathologic fracture of the manubrium, worrisome for multiple myeloma.  Patient is accompanied by his daughter during this visit. Patient notes he has been doing well overall without any new or severe medical concerns in the past 22-months. He denies any bone pain, chest pain, or joint pain in the past 39-months. He denies any recent falls or injuries to the chest wall.   He did have a fall in 2014, where he had hip surgery.   He denies any recent infection issues, fever, chills, night sweats, unexpected weight loss, back pain, abdominal pain, new lump/bumps, or leg swelling.   He does complain of balance issues. He uses walking stick and wheel-chair.   Patient currently lives independently and he has a home caretaker. His daughter notes that the current nursing aid is at the patient's house for 6-7 hours. His current home-care facility is [facility]. Patient's daughter is currently in the process to change the home-care facility.   He denies diabetes or lung-disease.  PmHx of COPD, emphysema, hypertension, pericardial effusion, and alcohol-induced dementia. Patient's daughter reports that the patient occasionally has hallucinations due to alcohol-induced dementia. He is currently not getting treated for pericardial effusion.   He currently smokes 2 packs of cigarettes a day. He used to be a heavy drinker -  around 6-12 packs of beer a day (around 4 years ago). Patient's daughter notes he now occasionally drinks 1-2 beer in a month. He does have past history of drug abuse.   Patient did have a PET scan on 03/25/2023, but still waiting on results.  INTERVAL HISTORY:  Austin [name] is a 68 y.o. male who is being connected with via telemedicine visit for continued evaluation and management of lytic bone lesions. He was last seen by me on 03/30/2023 and complained of balance issues, occasional hallucinations due to alcohol-induced dementia.   I connected with Bill K Dirden on 04/09/2023 at  3:30 PM EDT by telephone visit and verified that I am speaking with the correct person using two identifiers.   I discussed the limitations, risks, security and privacy concerns of performing an evaluation and management service by telemedicine and the availability of in-person appointments. I also discussed with the patient that there may be a patient responsible charge related to this service. The patient expressed understanding and agreed to proceed.   Other persons participating in the visit and their role in the encounter: Tiffany   Patient's location: home  Provider's location: [facility]   Chief Complaint: lytic bone lesions  The results of his recent lab workup were discussed in detail during today's visit   MEDICAL HISTORY:  Past Medical History:  Diagnosis Date   Dyspnea    Hip fracture (HCC)    Hypertension    Stroke [facility])     SURGICAL HISTORY: Past Surgical History:  Procedure Laterality Date   COLONOSCOPY N/A 11/05/2017   Procedure: COLONOSCOPY;  Surgeon:  Rourk, Gerrit Friends, MD;  Location: [facility];  Service: Endoscopy;  Laterality: N/A;  12:00   HIP PINNING,CANNULATED Right 07/24/2021   Procedure: CANNULATED HIP PINNING;  Surgeon: Vickki Hearing, MD;  Location: [facility] ORS;  Service: Orthopedics;  Laterality: Right;   POLYPECTOMY  11/05/2017   Procedure: POLYPECTOMY;  Surgeon: Corbin Ade, MD;  Location: [facility];  Service: Endoscopy;;  colon    SOCIAL HISTORY: Social History   Socioeconomic History   Marital status: Legally Separated    Spouse name: Not on file   Number of children: Not on file   Years of education: Not on file   Highest education level: Not on file  Occupational History   Not on file  Tobacco Use   Smoking status: Former    Current packs/day: 2.00    Average packs/day: 2.0 packs/day for 43.0 years (86.0 ttl pk-yrs)    Types: Cigarettes   Smokeless tobacco: Never  Vaping Use   Vaping status: Never Used  Substance and Sexual Activity   Alcohol use: Yes    Comment: beer 2 cans per day   Drug use: Yes    Types: Marijuana   Sexual activity: Yes    Birth control/protection: None  Other Topics Concern   Not on file  Social History Narrative   Not on file   Social Drivers of Health   Financial Resource Strain: Low Risk  (11/19/2022)   Overall Financial Resource Strain (CARDIA)    Difficulty of Paying Living Expenses: Not hard at all  Food Insecurity: No Food Insecurity (11/19/2022)   Hunger Vital Sign    Worried About Running Out of Food in the Last Year: Never true    Ran Out of Food in the Last Year: Never true  Transportation Needs: No Transportation Needs (11/19/2022)   PRAPARE - Administrator, Civil Service (Medical): No    Lack of Transportation (Non-Medical): No  Physical Activity: Inactive (11/19/2022)   Exercise Vital Sign    Days of Exercise per Week: 0 days    Minutes of Exercise per Session: 0 min  Stress: No Stress Concern Present (11/19/2022)   [employer] of Occupational Health - Occupational Stress Questionnaire    Feeling of Stress : Not at all  Social Connections: Socially Isolated (11/19/2022)   Social Connection and Isolation Panel [NHANES]    Frequency of Communication with Friends and Family: More than three times a week    Frequency of Social Gatherings with Friends and Family: More  than three times a week    Attends Religious Services: Never    Database administrator or Organizations: No    Attends Banker Meetings: Never    Marital Status: Divorced  Catering manager Violence: Not At Risk (11/19/2022)   Humiliation, Afraid, Rape, and Kick questionnaire    Fear of Current or Ex-Partner: No    Emotionally Abused: No    Physically Abused: No    Sexually Abused: No    FAMILY HISTORY: Family History  Problem Relation Age of Onset   Kidney disease Mother    Hypertension Mother    Cancer Mother    Hypertension Father    Atrial fibrillation Father    Heart attack Father    Heart failure Other    Cancer Other     ALLERGIES:  is allergic to fish allergy.  MEDICATIONS:  Current Outpatient Medications  Medication Sig Dispense Refill   amLODipine (NORVASC) 10 MG tablet  Take 1 tablet (10 mg total) by mouth daily. 90 tablet 1   olmesartan (BENICAR) 20 MG tablet Take 1 tablet (20 mg total) by mouth daily. 60 tablet 0   No current facility-administered medications for this visit.    REVIEW OF SYSTEMS:    10 Point review of Systems was done is negative except as noted above.   PHYSICAL EXAMINATION: TELEMEDICINE VISIT  LABORATORY DATA:  I have reviewed the data as listed  .    Latest Ref Rng & Units 03/30/2023   11:49 AM 10/12/2021   11:01 AM 07/28/2021    5:42 AM  CBC  WBC 4.0 - 10.5 K/uL 11.9  10.7  9.5   Hemoglobin 13.0 - 17.0 g/dL 78.2  95.6  21.3   Hematocrit 39.0 - 52.0 % 41.0  41.6  38.3   Platelets 150 - 400 K/uL 331  286  292     .    Latest Ref Rng & Units 03/30/2023   11:49 AM 06/10/2022    5:00 PM 10/12/2021   11:01 AM  CMP  Glucose 70 - 99 mg/dL 92  67  78   BUN 8 - 23 mg/dL 15  6  13    Creatinine 0.61 - 1.24 mg/dL 0.86  5.78  4.69   Sodium 135 - 145 mmol/L 144  145  144   Potassium 3.5 - 5.1 mmol/L 3.9  4.5  3.1   Chloride 98 - 111 mmol/L 110  104  109   CO2 22 - 32 mmol/L 28  22  30    Calcium 8.9 - 10.3 mg/dL 9.1  8.9   8.8   Total Protein 6.5 - 8.1 g/dL 7.5  7.4    Total Bilirubin 0.0 - 1.2 mg/dL 0.4  0.4    Alkaline Phos 38 - 126 U/L 56  63    AST 15 - 41 U/L 12  13    ALT 0 - 44 U/L 6  6       RADIOGRAPHIC STUDIES: I have personally reviewed the radiological images as listed and agreed with the findings in the report. NM PET Image Initial (PI) Skull Base To Thigh (F-18 FDG) Result Date: 03/30/2023 CLINICAL DATA:  Initial treatment strategy for lytic sternal lesion on lung cancer screening CT suspicious for multiple myeloma. EXAM: NUCLEAR MEDICINE PET SKULL BASE TO THIGH TECHNIQUE: 8.34 mCi F-18 FDG was injected intravenously. Full-ring PET imaging was performed from the skull base to thigh after the radiotracer. CT data was obtained and used for attenuation correction and anatomic localization. Fasting blood glucose: 123 mg/dl COMPARISON:  Chest CT 62/95/2841 and 10/12/2021. FINDINGS: Mediastinal blood pool activity: SUV max 2.1 NECK: No hypermetabolic cervical lymph nodes are identified. No suspicious activity identified within the pharyngeal mucosal space. Incidental CT findings: Bilateral carotid atherosclerosis. CHEST: There are no hypermetabolic mediastinal, hilar or axillary lymph nodes. No hypermetabolic pulmonary activity or suspicious nodularity. Incidental CT findings: Centrilobular and paraseptal emphysema with stable linear scarring at both lung bases. Atherosclerosis of the aorta, great vessels and coronary arteries. Stable small pericardial effusion. ABDOMEN/PELVIS: There is no hypermetabolic activity within the liver, adrenal glands, spleen or pancreas. There is no hypermetabolic nodal activity in the abdomen or pelvis. Hypermetabolic activity along the right aspect of the base of the penis (SUV max 4.3) is most likely secondary to urine contamination. Physical examination recommended to exclude penile lesion. Incidental CT findings: Aortic and branch vessel atherosclerosis. Diverticular changes in the  distal colon without evidence of acute  inflammation. Mild to moderate enlargement of the prostate gland. SKELETON: Expansile lytic lesion within the sternal manubrium is hypermetabolic with an SUV max of 4.2. No other definite hypermetabolic osseous lesions are identified. There is mild hypermetabolic activity within the right parietal scalp (SUV max 3.2) without corresponding CT abnormality. Mildly increased activity (SUV max 2.6) along the posterior left aspect of lower sacrum localizes to the overlying musculature and is likely incidental. Incidental CT findings: Multilevel spondylosis. Previous right proximal femoral pinning. IMPRESSION: 1. The expansile lytic lesion within the sternal manubrium is hypermetabolic and suspicious for neoplasm. Differential includes plasmacytoma, metastatic disease and primary osseous lesion. Consider tissue sampling. 2. No other hypermetabolic osseous lesions identified. 3. No evidence of extraosseous metastatic disease. 4. Hypermetabolic activity along the right aspect of the base of the penis is most likely secondary to urine contamination. Physical examination recommended to exclude penile lesion. 5. Mild hypermetabolic activity within the right parietal scalp without corresponding CT abnormality, possibly inflammatory. 6. Aortic Atherosclerosis (ICD10-I70.0) and Emphysema (ICD10-J43.9). Electronically Signed   By: Carey Bullocks M.D.   On: 03/30/2023 12:12    ASSESSMENT & PLAN:   68 y.o. 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 Newly diagnosed B12 deficiency. PLAN:  -Discussed lab results from 03/30/2023 in detail with patient. CBC showed WBC of 11.9K, hemoglobin of 13.5, and platelets of 331K. -no anemia -platelets normal -WBCs borderline elevated -CMP normal -patient is significantly vitamin B12 deficient -recommend replacing vitamin B12 with OTC sublingual B12 1000 mcg once daily for optimal energy, brain  function, nerve health, and bone marrow health  -patient is also vitamin D deficient with vitamin D level of 8.  -recommend taking higher-dose vitamin D tablet at 50,000 units, once a week -discussed incidental finding of lesion in sternum in the central breast bone.  -PET scan showed no sign of other primary tumors outside of the bone. Discussed that the lytic lesion in the sternal manubrium spot is active to some degree, which is thought by the radiologist to possibly be concerning for neoplasm. Discussed that there is no other primary tumor such as in the lung or prostate. He has no other obvious bone lesions.  -PSA level is WNL -there is some abnormal light chains as part of abnormal antibody molecule -Kappa free light chain 146.6 mg/L -lambda free light chain 21.5 mg/L -kappa free light chains are significantly higher than lambda light chains, which may suggest that one type of patient's plasma cell is overgrowing -patient found to have borderline IgM level of 218 mg/dL -discussed concern that his plasma cell or lymphocytes are abnormal, which raises concern for plasmacytoma or Lymphoplasmacytic lymphoma -discussed option to receive a biopsy of the bone then the bone marrow next, or to to receive both biopsies of the bone and bone marrow at the same time. Discussed that biopsies would help to determine whether there may be concern for plasmacytoma with isolated lesion or multiple myeloma with bone marrow involvement.  -will set up bone and bone marrow biopsy at the same time   -discussed that if there is localized plasmacytoma and no other bone lesions on his PET scan, treatment may only involve local radiation. If there is more broad disease, there may still be a role for only radiation therapy and monitoring remaining disease.   FOLLOW-UP: CT guided biopsy of sternal bone lesion concerning for plasmacytoma vs lymphoma and CT bone marrow aspiration and biopsy in 1 week RTC with Dr Candise Che  in 2-3  weeks  The total time spent in the appointment was 25 minutes* .  All of the patient's questions were answered with apparent satisfaction. The patient knows to call the clinic with any problems, questions or concerns.   Wyvonnia Lora MD MS AAHIVMS [facility] Hematology/Oncology Physician [facility]  .*Total Encounter Time as defined by the Centers for Medicare and Medicaid Services includes, in addition to the face-to-face time of a patient visit (documented in the note above) non-face-to-face time: obtaining and reviewing outside history, ordering and reviewing medications, tests or procedures, care coordination (communications with other health care professionals or caregivers) and documentation in the medical record.    I,Mitra Faeizi,acting as a Neurosurgeon for Wyvonnia Lora, MD.,have documented all relevant documentation on the behalf of Wyvonnia Lora, MD,as directed by  Wyvonnia Lora, MD while in the presence of Wyvonnia Lora, MD.  .I have reviewed the above documentation for accuracy and completeness, and I agree with the above. Johney Maine MD

## 2023-04-08 NOTE — Assessment & Plan Note (Signed)
 Previously documented history of alcohol induced persisting dementia.  MoCA assessment administered today.  Patient scored 18/30, consistent with mild cognitive impairment. He is oriented to person and place but does not know the year or date.  He scored poorly on delayed recall.  He does appear to have some degree of insight into his current circumstances, though he is unsure about today's appointment.  He acknowledges that he has a home health aide and receives Meals on Wheels.  He administers his medications daily with a reminder from his home health aide.  He states that he would like his daughter, Austin [name], to make decisions on his behalf in the event that he is unable to do so.

## 2023-04-08 NOTE — Assessment & Plan Note (Signed)
 Mr. Liller presents today with his daughter for an acute visit to discuss guardianship as described above.  The patient's daughter, [name], states that she is her father's healthcare power of attorney but I have not found to be supporting documents in the patient's chart.  She says that she has a copy on her phone and will forward a copy to our office so that it can be uploaded to his medical record.  Her chief concern today is wanting to establish guardianship because she is concerned that her father is being taken advantage of as described above.  She says that his home health agency has been signing documents on his behalf without the patient's knowing.  No specific examples were provided, however the patient acknowledges that documents have been signed with his signature by his home health aide. -Social work referral placed today for assistance with navigating these complicated circumstances -I have asked the patient's daughter to provide documentation supporting her claim as the patient's HCPOA

## 2023-04-09 ENCOUNTER — Inpatient Hospital Stay: Attending: Hematology | Admitting: Hematology

## 2023-04-09 DIAGNOSIS — M898X9 Other specified disorders of bone, unspecified site: Secondary | ICD-10-CM | POA: Diagnosis not present

## 2023-04-09 DIAGNOSIS — D8989 Other specified disorders involving the immune mechanism, not elsewhere classified: Secondary | ICD-10-CM | POA: Diagnosis not present

## 2023-04-09 DIAGNOSIS — M899 Disorder of bone, unspecified: Secondary | ICD-10-CM | POA: Insufficient documentation

## 2023-04-12 ENCOUNTER — Telehealth: Payer: Self-pay | Admitting: Hematology

## 2023-04-12 NOTE — Telephone Encounter (Signed)
 Spoke with patient daughter confirming upcoming appointments

## 2023-04-21 ENCOUNTER — Telehealth: Payer: Self-pay | Admitting: *Deleted

## 2023-04-21 NOTE — Progress Notes (Signed)
 Complex Care Management Note  Care Guide Note 04/21/2023 Name: Austin [name] MRN: [id number] DOB: [birth year]  Austin [name] is a 68 y.o. year old male who sees Tobi Fortes, MD for primary care. I reached out to [employer] by phone today to offer complex care management services.  Mr. Steig daughter Austin [name] was given information about Complex Care Management services today including:   The Complex Care Management services include support from the care team which includes your Nurse Care Manager, Clinical Social Worker, or Pharmacist.  The Complex Care Management team is here to help remove barriers to the health concerns and goals most important to you. Complex Care Management services are voluntary, and the patient may decline or stop services at any time by request to their care team member.   Complex Care Management Consent Status: Patient daughter Austin [name] agreed to services and verbal consent obtained.   Follow up plan:  Telephone appointment with complex care management team member scheduled for:  4/23  Encounter Outcome:  Patient Scheduled  Barnie Bora  [facility]  [facility], [facility] Guide  Direct Dial: [phone]  Fax [phone]

## 2023-04-23 ENCOUNTER — Ambulatory Visit (HOSPITAL_COMMUNITY)

## 2023-04-28 ENCOUNTER — Other Ambulatory Visit: Payer: Self-pay | Admitting: Licensed Clinical Social Worker

## 2023-04-28 NOTE — Patient Instructions (Signed)
 Visit Information  Thank you for taking time to visit with me today. Please don't hesitate to contact me if I can be of assistance to you before our next scheduled appointment.     Closing From: Complex Care Management.  Please call the care guide team at [phone] if you need to cancel, schedule, or reschedule an appointment.   Please call 911 if you are experiencing a Mental Health or Behavioral Health Crisis or need someone to talk to.  Gwyndolyn Saxon MSW, LCSW Licensed Clinical Social Worker  [facility], Population Health Direct Dial: [phone]  Fax: [phone]

## 2023-04-28 NOTE — Patient Outreach (Signed)
 Complex Care Management   Visit Note  04/28/2023  Name:  Austin [name] MRN: [id number] DOB: 10-Jul-1955  Situation: Referral received for Complex Care Management related to  advance care planning  I obtained verbal consent from Patient.  Visit completed with F Verma  on the phone  Background:   Past Medical History:  Diagnosis Date   Dyspnea    Hip fracture (HCC)    Hypertension    Stroke [facility])     Assessment: Patient Reported Symptoms:  Cognitive        Neurological      HEENT        Cardiovascular      Respiratory      Endocrine      Gastrointestinal        Genitourinary      Integumentary      Musculoskeletal          Psychosocial Psychosocial Symptoms Reported: Not assessed Additional Psychological Details: spk with daughter. Pt unavailable     Do you feel physically threatened by others?: No      04/08/2023    1:46 PM  Depression screen PHQ 2/9  Decreased Interest 0  Down, Depressed, Hopeless 1  PHQ - 2 Score 1  Altered sleeping 3  Tired, decreased energy 3  Change in appetite 3  Feeling bad or failure about yourself  1  Trouble concentrating 3  Moving slowly or fidgety/restless 3  Suicidal thoughts 0  PHQ-9 Score 17  Difficult doing work/chores Very difficult    There were no vitals filed for this visit.  Medications Reviewed Today   Medications were not reviewed in this encounter     Recommendation:   Pt daughter advised to provided pcp with a copy of the HCPOA  Follow Up Plan:   Patient has met all care management goals. Care Management case will be closed. Patient has been provided contact information should new needs arise.   Fletcher Humble MSW, LCSW Licensed Clinical Social Worker  [facility], Population Health Direct Dial: [phone]  Fax: [phone]

## 2023-05-05 ENCOUNTER — Ambulatory Visit: Admitting: Hematology

## 2023-05-13 ENCOUNTER — Other Ambulatory Visit: Payer: Self-pay | Admitting: Radiology

## 2023-05-13 DIAGNOSIS — M898X9 Other specified disorders of bone, unspecified site: Secondary | ICD-10-CM

## 2023-05-13 NOTE — H&P (Signed)
 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  He presents today for image guided sternal lytic lesion biopsy as well as bone marrow biopsy.   Patient is Full Code  Past Medical History:  Diagnosis Date   Dyspnea    Hip fracture (HCC)    Hypertension    Stroke [facility])     Past Surgical History:  Procedure Laterality Date   COLONOSCOPY N/A 11/05/2017   Procedure: COLONOSCOPY;  Surgeon: Suzette Espy, MD;  Location: [facility] ENDO SUITE;  Service: Endoscopy;  Laterality: N/A;  12:00   HIP PINNING,CANNULATED Right 07/24/2021   Procedure: CANNULATED HIP PINNING;  Surgeon: Darrin Emerald, MD;  Location: [facility] ORS;  Service: Orthopedics;  Laterality: Right;   POLYPECTOMY  11/05/2017   Procedure: POLYPECTOMY;  Surgeon: Suzette Espy, MD;  Location: [facility] ENDO SUITE;  Service: Endoscopy;;  colon    Allergies: Fish allergy  Medications: Prior to Admission medications   Medication Sig Start Date End Date Taking? Authorizing Provider  amLODipine  (NORVASC ) 10 MG tablet Take 1 tablet (10 mg total) by mouth daily. 02/02/23 03/30/23  Tobi Fortes, MD  olmesartan  (BENICAR ) 20 MG tablet Take 1 tablet (20 mg total) by mouth daily. 07/13/22   Lillia Reilly, MD     Family History  Problem Relation Age of Onset   Kidney disease Mother    Hypertension Mother    Cancer Mother    Hypertension Father    Atrial  fibrillation Father    Heart attack Father    Heart failure Other    Cancer Other     Social History   Socioeconomic History   Marital status: Legally Separated    Spouse name: Not on file   Number of children: Not on file   Years of education: Not on file   Highest education level: Not on file  Occupational History   Not on file  Tobacco Use   Smoking status: Former    Current packs/day: 2.00    Average packs/day: 2.0 packs/day for 43.0 years (86.0 ttl pk-yrs)    Types: Cigarettes   Smokeless tobacco: Never  Vaping Use   Vaping status: Never Used  Substance and Sexual Activity   Alcohol use: Yes    Comment: beer 2 cans per day   Drug use: Yes    Types: Marijuana   Sexual activity: Yes    Birth control/protection: None  Other Topics Concern   Not on file  Social History Narrative   Not on file   Social Drivers of Health   Financial Resource Strain: Low Risk  (11/19/2022)   Overall Financial Resource Strain (CARDIA)    Difficulty of Paying Living Expenses: Not hard at all  Food Insecurity: No Food Insecurity (04/28/2023)   Hunger Vital Sign    Worried About Running Out of Food in the Last Year: Never true    Ran Out of Food in the Last Year: Never true  Transportation Needs: No  Transportation Needs (04/28/2023)   PRAPARE - Administrator, Civil Service (Medical): No    Lack of Transportation (Non-Medical): No  Physical Activity: Inactive (11/19/2022)   Exercise Vital Sign    Days of Exercise per Week: 0 days    Minutes of Exercise per Session: 0 min  Stress: No Stress Concern Present (11/19/2022)   [employer] of Occupational Health - Occupational Stress Questionnaire    Feeling of Stress : Not at all  Social Connections: Socially Isolated (11/19/2022)   Social Connection and Isolation Panel [NHANES]    Frequency of Communication with Friends and Family: More than three times a week    Frequency of Social Gatherings with Friends and Family:  More than three times a week    Attends Religious Services: Never    Database administrator or Organizations: No    Attends Engineer, structural: Never    Marital Status: Divorced       Review of Systems denies fever, HA,CP,dyspnea, abd pain, back pain,N/V or bleeding; he does have occ cough  Vital Signs:pending    Advance Care Plan: no documents on file  Physical Exam awake/answers simple questions ok;  chest- clear but distant BS  bilat; heart- RRR; abd-soft,+BS,NT ; no LE edema  Imaging: No results found.  Labs:  CBC: Recent Labs    03/30/23 1149  WBC 11.9*  HGB 13.5  HCT 41.0  PLT 331    COAGS: No results for input(s): "INR", "APTT" in the last 8760 hours.  BMP: Recent Labs    06/10/22 1700 03/30/23 1149  NA 145* 144  K 4.5 3.9  CL 104 110  CO2 22 28  GLUCOSE 67* 92  BUN 6* 15  CALCIUM 8.9 9.1  CREATININE 1.03 1.02  GFRNONAA  --  >60    LIVER FUNCTION TESTS: Recent Labs    06/10/22 1700 03/30/23 1149  BILITOT 0.4 0.4  AST 13 12*  ALT 6 6  ALKPHOS 63 56  PROT 7.4 7.5  ALBUMIN 4.6 4.2    TUMOR MARKERS: No results for input(s): "AFPTM", "CEA", "CA199", "CHROMGRNA" in the last 8760 hours.  Assessment and Plan: 68 y.o. 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  He presents today for image guided sternal lytic lesion biopsy as well as bone marrow biopsy.Risks and benefits of procedure was discussed with the patient/daughter  including, but not limited to bleeding, infection, damage to adjacent structures or low yield requiring additional tests.  All of the questions were answered and there is agreement to proceed.  Consent signed and in chart.    Thank you for allowing our service to participate in Austin [name] 's care.  Electronically  Signed: D. Honore Lux, PA-C   05/13/2023, 4:48 PM      I spent a total of 25 minutes   in face to face in clinical consultation, greater than 50% of which was counseling/coordinating care for image guided sternal lytic lesion biopsy and bone marrow biopsy

## 2023-05-14 ENCOUNTER — Ambulatory Visit (HOSPITAL_COMMUNITY)
Admission: RE | Admit: 2023-05-14 | Discharge: 2023-05-14 | Disposition: A | Discharge status: Home / Self Care | Source: Ambulatory Visit | Attending: Hematology | Admitting: Hematology

## 2023-05-14 ENCOUNTER — Encounter (HOSPITAL_COMMUNITY): Payer: Self-pay

## 2023-05-14 ENCOUNTER — Other Ambulatory Visit (HOSPITAL_COMMUNITY): Payer: Self-pay | Admitting: Hematology

## 2023-05-14 ENCOUNTER — Other Ambulatory Visit: Payer: Self-pay | Admitting: Hematology

## 2023-05-14 DIAGNOSIS — Z87891 Personal history of nicotine dependence: Secondary | ICD-10-CM | POA: Insufficient documentation

## 2023-05-14 DIAGNOSIS — D8989 Other specified disorders involving the immune mechanism, not elsewhere classified: Secondary | ICD-10-CM | POA: Diagnosis present

## 2023-05-14 DIAGNOSIS — I10 Essential (primary) hypertension: Secondary | ICD-10-CM | POA: Insufficient documentation

## 2023-05-14 DIAGNOSIS — M898X9 Other specified disorders of bone, unspecified site: Secondary | ICD-10-CM | POA: Diagnosis not present

## 2023-05-14 DIAGNOSIS — R897 Abnormal histological findings in specimens from other organs, systems and tissues: Secondary | ICD-10-CM | POA: Insufficient documentation

## 2023-05-14 DIAGNOSIS — Z8673 Personal history of transient ischemic attack (TIA), and cerebral infarction without residual deficits: Secondary | ICD-10-CM | POA: Diagnosis not present

## 2023-05-14 DIAGNOSIS — Z1379 Encounter for other screening for genetic and chromosomal anomalies: Secondary | ICD-10-CM | POA: Insufficient documentation

## 2023-05-14 DIAGNOSIS — D72822 Plasmacytosis: Secondary | ICD-10-CM | POA: Diagnosis not present

## 2023-05-14 LAB — CBC WITH DIFFERENTIAL/PLATELET
Abs Immature Granulocytes: 0.04 10*3/uL (ref 0.00–0.07)
Basophils Absolute: 0.1 10*3/uL (ref 0.0–0.1)
Basophils Relative: 1 %
Eosinophils Absolute: 0.3 10*3/uL (ref 0.0–0.5)
Eosinophils Relative: 3 %
HCT: 43.3 % (ref 39.0–52.0)
Hemoglobin: 14.1 g/dL (ref 13.0–17.0)
Immature Granulocytes: 0 %
Lymphocytes Relative: 37 %
Lymphs Abs: 3.7 10*3/uL (ref 0.7–4.0)
MCH: 29.6 pg (ref 26.0–34.0)
MCHC: 32.6 g/dL (ref 30.0–36.0)
MCV: 90.8 fL (ref 80.0–100.0)
Monocytes Absolute: 0.8 10*3/uL (ref 0.1–1.0)
Monocytes Relative: 8 %
Neutro Abs: 5.2 10*3/uL (ref 1.7–7.7)
Neutrophils Relative %: 51 %
Platelets: 311 10*3/uL (ref 150–400)
RBC: 4.77 MIL/uL (ref 4.22–5.81)
RDW: 14.5 % (ref 11.5–15.5)
WBC: 10.2 10*3/uL (ref 4.0–10.5)
nRBC: 0 % (ref 0.0–0.2)

## 2023-05-14 LAB — PROTIME-INR
INR: 1.1 (ref 0.8–1.2)
Prothrombin Time: 14.4 s (ref 11.4–15.2)

## 2023-05-14 MED ORDER — FENTANYL CITRATE (PF) 100 MCG/2ML IJ SOLN
INTRAMUSCULAR | Status: AC
  Filled 2023-05-14: qty 6

## 2023-05-14 MED ORDER — FLUMAZENIL 0.5 MG/5ML IV SOLN
INTRAVENOUS | Status: AC
  Filled 2023-05-14: qty 5

## 2023-05-14 MED ORDER — FENTANYL CITRATE (PF) 100 MCG/2ML IJ SOLN
INTRAMUSCULAR | Status: AC | PRN
  Administered 2023-05-14: 50 ug via INTRAVENOUS

## 2023-05-14 MED ORDER — MIDAZOLAM HCL 2 MG/2ML IJ SOLN
INTRAMUSCULAR | Status: AC | PRN
  Administered 2023-05-14: 1 mg via INTRAVENOUS

## 2023-05-14 MED ORDER — LIDOCAINE HCL 1 % IJ SOLN
INTRAMUSCULAR | Status: AC | PRN
  Administered 2023-05-14: 10 mL via INTRADERMAL

## 2023-05-14 MED ORDER — SODIUM CHLORIDE 0.9 % IV SOLN: INTRAVENOUS | Status: DC

## 2023-05-14 MED ORDER — NALOXONE HCL 0.4 MG/ML IJ SOLN
INTRAMUSCULAR | Status: AC
  Filled 2023-05-14: qty 1

## 2023-05-14 MED ORDER — MIDAZOLAM HCL 2 MG/2ML IJ SOLN
INTRAMUSCULAR | Status: AC
  Filled 2023-05-14: qty 6

## 2023-05-14 MED ORDER — FENTANYL CITRATE (PF) 100 MCG/2ML IJ SOLN
INTRAMUSCULAR | Status: AC | PRN
  Administered 2023-05-14: 25 ug via INTRAVENOUS

## 2023-05-14 NOTE — Sedation Documentation (Signed)
 Bone marrow bx complete. Patient being prepped for sternum bx.

## 2023-05-14 NOTE — Sedation Documentation (Signed)
 Sternal biopsy procedure begun.

## 2023-05-14 NOTE — Discharge Instructions (Addendum)
 Bone Marrow Aspiration and Bone Marrow Biopsy, Adult, Care After  The following information offers guidance on how to care for yourself after your procedure. Your health care provider may also give you more specific instructions. If you have problems or questions, contact your health care provider.  What can I expect after the procedure?  May remove dressings or bandaid and shower tomorrow.  Keep site clean and dry. Replace with clean dressing or bandaid as necessary. Urgent needs IR clinic [phone] (mon-fri 8-5).  After the procedure, it is common to have: Mild pain and tenderness. Swelling. Bruising. Follow these instructions at home: Incision care  Follow instructions from your health care provider about how to take care of the incision site. Make sure you: Wash your hands with soap and water  for at least 20 seconds before and after you change your bandage (dressing). If soap and water  are not available, use hand sanitizer. Change your dressing as told by your health care provider. Leave stitches (sutures), skin glue, or adhesive strips in place. These skin closures may need to stay in place for 2 weeks or longer. If adhesive strip edges start to loosen and curl up, you may trim the loose edges. Do not remove adhesive strips completely unless your health care provider tells you to do that. Check your incision / puncture sites every day for signs of infection. Check for: More redness, swelling, or pain. Fluid or blood. Warmth. Pus or a bad smell. Activity Return to your normal activities as told by your health care provider. Ask your health care provider what activities are safe for you. Do not lift anything that is heavier than 10 lb (4.5 kg), or the limit that you are told, until your health care provider says that it is safe. If you were given a sedative during the procedure, it can affect you for several hours. Do not drive or operate machinery until your health care provider  says that it is safe. General instructions  Take over-the-counter and prescription medicines only as told by your health care provider. Do not take baths, swim, or use a hot tub until your health care provider approves. Ask your health care provider if you may take showers. You may only be allowed to take sponge baths. If directed, put ice on the affected area. To do this: Put ice in a plastic bag. Place a towel between your skin and the bag. Leave the ice on for 20 minutes, 2-3 times a day. If your skin turns bright red, remove the ice right away to prevent skin damage. The risk of skin damage is higher if you cannot feel pain, heat, or cold. Contact a health care provider if: You have signs of infection. Your pain is not controlled with medicine. You have cancer, and a temperature of 100.35F (38C) or higher. Get help right away if: You have a temperature of 101F (38.3C) or higher, or as told by your health care provider. You have bleeding from the incision site that cannot be controlled. This information is not intended to replace advice given to you by your health care provider. Make sure you discuss any questions you have with your health care provider. Document Revised: 04/28/2021 Document Reviewed: 04/28/2021 Elsevier Patient Education  2023 Elsevier Inc.     Moderate Conscious Sedation, Adult, Care After  This sheet gives you information about how to care for yourself after your procedure. Your health care provider may also give you more specific instructions. If you have problems or  questions, contact your health care provider. What can I expect after the procedure? After the procedure, it is common to have: Sleepiness for several hours. Impaired judgment for several hours. Difficulty with balance. Vomiting if you eat too soon. Follow these instructions at home: For the time period you were told by your health care provider:     Rest. Do not participate in  activities where you could fall or become injured. Do not drive or use machinery. Do not drink alcohol. Do not take sleeping pills or medicines that cause drowsiness. Do not make important decisions or sign legal documents. Do not take care of children on your own. Eating and drinking  Follow the diet recommended by your health care provider. Drink enough fluid to keep your urine pale yellow. If you vomit: Drink water , juice, or soup when you can drink without vomiting. Make sure you have little or no nausea before eating solid foods. General instructions Take over-the-counter and prescription medicines only as told by your health care provider. Have a responsible adult stay with you for the time you are told. It is important to have someone help care for you until you are awake and alert. Do not smoke. Keep all follow-up visits as told by your health care provider. This is important. Contact a health care provider if: You are still sleepy or having trouble with balance after 24 hours. You feel light-headed. You keep feeling nauseous or you keep vomiting. You develop a rash. You have a fever. You have redness or swelling around the IV site. Get help right away if: You have trouble breathing. You have new-onset confusion at home. Summary After the procedure, it is common to feel sleepy, have impaired judgment, or feel nauseous if you eat too soon. Rest after you get home. Know the things you should not do after the procedure. Follow the diet recommended by your health care provider and drink enough fluid to keep your urine pale yellow. Get help right away if you have trouble breathing or new-onset confusion at home. This information is not intended to replace advice given to you by your health care provider. Make sure you discuss any questions you have with your health care provider. Document Revised: 04/21/2019 Document Reviewed: 11/17/2018 Elsevier Patient Education  2023  [employer].

## 2023-05-14 NOTE — Procedures (Signed)
 Interventional Radiology Procedure:   Indications: Lytic lesion in manubrium  Procedure: CT guided bone marrow biopsy and CT guided core biopsy of lytic bone lesion  Findings: 2 aspirates and 2 cores from right ilium.  Core biopsies from lytic lesion in manubrium  Complications: None     EBL: Minimal, less than 10 ml  Plan: Discharge to home in one hour.   Avion Patella R. Julietta Ogren, MD  Pager: [phone]

## 2023-05-18 ENCOUNTER — Inpatient Hospital Stay: Attending: Hematology | Admitting: Hematology

## 2023-05-18 DIAGNOSIS — F17210 Nicotine dependence, cigarettes, uncomplicated: Secondary | ICD-10-CM | POA: Insufficient documentation

## 2023-05-18 DIAGNOSIS — C9030 Solitary plasmacytoma not having achieved remission: Secondary | ICD-10-CM | POA: Insufficient documentation

## 2023-05-18 DIAGNOSIS — E538 Deficiency of other specified B group vitamins: Secondary | ICD-10-CM | POA: Insufficient documentation

## 2023-05-18 LAB — SURGICAL PATHOLOGY

## 2023-05-24 ENCOUNTER — Encounter (HOSPITAL_COMMUNITY): Payer: Self-pay | Admitting: Hematology

## 2023-05-24 ENCOUNTER — Encounter: Payer: Self-pay | Admitting: Hematology

## 2023-05-25 ENCOUNTER — Encounter (HOSPITAL_COMMUNITY): Payer: Self-pay | Admitting: Hematology

## 2023-05-26 ENCOUNTER — Inpatient Hospital Stay (HOSPITAL_BASED_OUTPATIENT_CLINIC_OR_DEPARTMENT_OTHER): Admitting: Hematology

## 2023-05-26 VITALS — BP 131/83 | HR 81 | Temp 97.5°F | Resp 18 | Wt 153.7 lb

## 2023-05-26 DIAGNOSIS — E8809 Other disorders of plasma-protein metabolism, not elsewhere classified: Secondary | ICD-10-CM | POA: Diagnosis not present

## 2023-05-26 DIAGNOSIS — E538 Deficiency of other specified B group vitamins: Secondary | ICD-10-CM | POA: Diagnosis not present

## 2023-05-26 DIAGNOSIS — C9030 Solitary plasmacytoma not having achieved remission: Secondary | ICD-10-CM

## 2023-05-26 DIAGNOSIS — F17210 Nicotine dependence, cigarettes, uncomplicated: Secondary | ICD-10-CM | POA: Diagnosis not present

## 2023-05-26 NOTE — Progress Notes (Signed)
 HEMATOLOGY/ONCOLOGY CLINIC VISIT NOTE  Date of Service: 05/26/23  Patient Care Team: Tobi Fortes, MD as PCP - General (Internal Medicine) Alyce Jubilee, MD (Inactive) as Consulting Physician (Gastroenterology) Fletcher Humble, LCSW as Social Worker (Licensed Clinical Social Worker)  CHIEF COMPLAINTS/PURPOSE OF CONSULTATION:  Lytic bone lesions on Ct scan. -- newly diagnosed plasmacytoma  HISTORY OF PRESENTING ILLNESS:   Austin [name] is a wonderful 68 y.o. male who has been referred to us  by NP Dara Ear for evaluation and management of lytic bone lesions on CT scan.   CT Chest Lung CA Screen Low dose W/O CM from 02/18/2023 showed Lytic lesion and age-indeterminate pathologic fracture of the manubrium, worrisome for multiple myeloma.  Patient is accompanied by his daughter during this visit. Patient notes he has been doing well overall without any new or severe medical concerns in the past 26-months. He denies any bone pain, chest pain, or joint pain in the past 37-months. He denies any recent falls or injuries to the chest wall.   He did have a fall in 2014, where he had hip surgery.   He denies any recent infection issues, fever, chills, night sweats, unexpected weight loss, back pain, abdominal pain, new lump/bumps, or leg swelling.   He does complain of balance issues. He uses walking stick and wheel-chair.   Patient currently lives independently and he has a home caretaker. His daughter notes that the current nursing aid is at the patient's house for 6-7 hours. His current home-care facility is [facility]. Patient's daughter is currently in the process to change the home-care facility.   He denies diabetes or lung-disease.  PmHx of COPD, emphysema, hypertension, pericardial effusion, and alcohol-induced dementia. Patient's daughter reports that the patient occasionally has hallucinations due to alcohol-induced dementia. He is currently not getting treated for  pericardial effusion.   He currently smokes 2 packs of cigarettes a day. He used to be a heavy drinker - around 6-12 packs of beer a day (around 4 years ago). Patient's daughter notes he now occasionally drinks 1-2 beer in a month. He does have past history of drug abuse.   Patient did have a PET scan on 03/25/2023, but still waiting on results.  INTERVAL HISTORY:  Austin [name] is a 68 y.o. male who is here for continued evaluation and management of lytic bone lesions. I last connected with patient via telemedicine visit on 04/09/2023.   Patient presents in a wheelchair during today's visit and is accompanied by his daughter. We discussed the results of his recent lytic bone lesion biopsy in detail.   He denies any pain in the sternum. Patient has no other bone pain. He has been eating well, staying well-hydrated, and sleeping well. He reports general sense of old age.  Patient noted to have lost 4 pounds in last 2 months with current weight of 153 pounds.   He generally consumes 1 beer every 2-3 weeks. Patient continues to smoke, averaging about one pack a day.   MEDICAL HISTORY:  Past Medical History:  Diagnosis Date   Dyspnea    Hip fracture (HCC)    Hypertension    Stroke [facility])     SURGICAL HISTORY: Past Surgical History:  Procedure Laterality Date   COLONOSCOPY N/A 11/05/2017   Procedure: COLONOSCOPY;  Surgeon: Suzette Espy, MD;  Location: [facility];  Service: Endoscopy;  Laterality: N/A;  12:00   HIP PINNING,CANNULATED Right 07/24/2021   Procedure: CANNULATED HIP PINNING;  Surgeon:  Darrin Emerald, MD;  Location: [facility];  Service: Orthopedics;  Laterality: Right;   POLYPECTOMY  11/05/2017   Procedure: POLYPECTOMY;  Surgeon: Suzette Espy, MD;  Location: [facility];  Service: Endoscopy;;  colon    SOCIAL HISTORY: Social History   Socioeconomic History   Marital status: Legally Separated    Spouse name: Not on file   Number of children: Not on file    Years of education: Not on file   Highest education level: Not on file  Occupational History   Not on file  Tobacco Use   Smoking status: Every Day    Current packs/day: 2.00    Average packs/day: 2.0 packs/day for 43.0 years (86.0 ttl pk-yrs)    Types: Cigarettes   Smokeless tobacco: Never  Vaping Use   Vaping status: Never Used  Substance and Sexual Activity   Alcohol use: Yes    Comment: beer 2 cans per day   Drug use: Yes    Types: Marijuana   Sexual activity: Yes    Birth control/protection: None  Other Topics Concern   Not on file  Social History Narrative   Not on file   Social Drivers of Health   Financial Resource Strain: Low Risk  (11/19/2022)   Overall Financial Resource Strain (CARDIA)    Difficulty of Paying Living Expenses: Not hard at all  Food Insecurity: No Food Insecurity (04/28/2023)   Hunger Vital Sign    Worried About Running Out of Food in the Last Year: Never true    Ran Out of Food in the Last Year: Never true  Transportation Needs: No Transportation Needs (04/28/2023)   PRAPARE - Administrator, Civil Service (Medical): No    Lack of Transportation (Non-Medical): No  Physical Activity: Inactive (11/19/2022)   Exercise Vital Sign    Days of Exercise per Week: 0 days    Minutes of Exercise per Session: 0 min  Stress: No Stress Concern Present (11/19/2022)   [employer] of Occupational Health - Occupational Stress Questionnaire    Feeling of Stress : Not at all  Social Connections: Socially Isolated (11/19/2022)   Social Connection and Isolation Panel [NHANES]    Frequency of Communication with Friends and Family: More than three times a week    Frequency of Social Gatherings with Friends and Family: More than three times a week    Attends Religious Services: Never    Database administrator or Organizations: No    Attends Banker Meetings: Never    Marital Status: Divorced  Catering manager Violence: Not At Risk  (04/28/2023)   Humiliation, Afraid, Rape, and Kick questionnaire    Fear of Current or Ex-Partner: No    Emotionally Abused: No    Physically Abused: No    Sexually Abused: No    FAMILY HISTORY: Family History  Problem Relation Age of Onset   Kidney disease Mother    Hypertension Mother    Cancer Mother    Hypertension Father    Atrial fibrillation Father    Heart attack Father    Heart failure Other    Cancer Other     ALLERGIES:  is allergic to fish allergy.  MEDICATIONS:  Current Outpatient Medications  Medication Sig Dispense Refill   amLODipine  (NORVASC ) 10 MG tablet Take 1 tablet (10 mg total) by mouth daily. 90 tablet 1   olmesartan  (BENICAR ) 20 MG tablet Take 1 tablet (20 mg total) by mouth daily. 60 tablet  0   No current facility-administered medications for this visit.    REVIEW OF SYSTEMS:    10 Point review of Systems was done is negative except as noted above.   PHYSICAL EXAMINATION:  .BP 131/83   Pulse 81   Temp (!) 97.5 F (36.4 C)   Resp 18   Wt 153 lb 11.2 oz (69.7 kg)   SpO2 98%   BMI 22.70 kg/m  GENERAL:alert, in no acute distress and comfortable SKIN: no acute rashes, no significant lesions EYES: conjunctiva are pink and non-injected, sclera anicteric OROPHARYNX: MMM, no exudates, no oropharyngeal erythema or ulceration NECK: supple, no JVD LYMPH:  no palpable lymphadenopathy in the cervical, axillary or inguinal regions LUNGS: clear to auscultation b/l with normal respiratory effort HEART: regular rate & rhythm ABDOMEN:  normoactive bowel sounds , non tender, not distended. Extremity: no pedal edema PSYCH: alert & oriented x 3 with fluent speech NEURO: no focal motor/sensory deficits   LABORATORY DATA:  I have reviewed the data as listed  .    Latest Ref Rng & Units 05/14/2023    9:32 AM 03/30/2023   11:49 AM 10/12/2021   11:01 AM  CBC  WBC 4.0 - 10.5 K/uL 10.2  11.9  10.7   Hemoglobin 13.0 - 17.0 g/dL 11.9  14.7  82.9    Hematocrit 39.0 - 52.0 % 43.3  41.0  41.6   Platelets 150 - 400 K/uL 311  331  286     .    Latest Ref Rng & Units 03/30/2023   11:49 AM 06/10/2022    5:00 PM 10/12/2021   11:01 AM  CMP  Glucose 70 - 99 mg/dL 92  67  78   BUN 8 - 23 mg/dL 15  6  13    Creatinine 0.61 - 1.24 mg/dL 5.62  1.30  8.65   Sodium 135 - 145 mmol/L 144  145  144   Potassium 3.5 - 5.1 mmol/L 3.9  4.5  3.1   Chloride 98 - 111 mmol/L 110  104  109   CO2 22 - 32 mmol/L 28  22  30    Calcium 8.9 - 10.3 mg/dL 9.1  8.9  8.8   Total Protein 6.5 - 8.1 g/dL 7.5  7.4    Total Bilirubin 0.0 - 1.2 mg/dL 0.4  0.4    Alkaline Phos 38 - 126 U/L 56  63    AST 15 - 41 U/L 12  13    ALT 0 - 44 U/L 6  6     05/25/2023 Molecular genetics:    05/24/2023 Cytogenetics:   05/14/2023 Biopsy Lytic bone lesion:   RADIOGRAPHIC STUDIES: I have personally reviewed the radiological images as listed and agreed with the findings in the report.  CT BIOPSY Result Date: 05/14/2023 INDICATION: 68 year old with a lytic lesion involving the sternal manubrium. Procedure was performed immediately following the CT-guided bone marrow biopsy. EXAM: CT-GUIDED BIOPSY OF LYTIC BONE LESION IN THE MANUBRIUM Physician: Olive Better. Julietta Ogren, MD MEDICATIONS: Fentanyl  25 mcg ANESTHESIA/SEDATION: Patient's level of consciousness and vital signs were monitored continuously by radiology nurse throughout the procedure under the supervision of the provider performing the procedure. COMPLICATIONS: None immediate. PROCEDURE: Patient was placed supine. CT images of the chest were obtained. Lytic lesion involving the sternal manubrium was identified and targeted. The right anterior chest was prepped with chlorhexidine  and sterile field was created. Skin was anesthetized using 1% lidocaine . Small incision was made. Using CT guidance, 17 gauge coaxial needle  was easily directed through the anterior cortex into the lucent portion of the lesion. Core biopsies were obtained with an 18  gauge device. Specimens placed in formalin. 17 gauge coaxial needle was removed without complication. Bandage placed over the puncture site. FINDINGS: Mildly expansile lytic lesion involving the sternal manubrium. Biopsy needle confirmed within the lytic lesion. IMPRESSION: CT-guided core biopsies of the lytic lesion involving the manubrium. Electronically Signed   By: Elene Griffes M.D.   On: 05/14/2023 16:52   CT BONE MARROW BIOPSY & ASPIRATION Result Date: 05/14/2023 INDICATION: 68 year old with a lytic sternal lesion and light chain disease, kappa type. Request for bone marrow biopsy. EXAM: CT GUIDED BONE MARROW ASPIRATES AND BIOPSY Physician: Olive Better. Henn, MD MEDICATIONS: Moderate sedation ANESTHESIA/SEDATION: Moderate (conscious) sedation was employed during this procedure. A total of Versed  2 mg and fentanyl  100 mcg was administered intravenously at the order of the provider performing the procedure. Total intra-service moderate sedation time: 13 minutes. Patient's level of consciousness and vital signs were monitored continuously by radiology nurse throughout the procedure under the supervision of the provider performing the procedure. COMPLICATIONS: None immediate. PROCEDURE: The procedure was explained to the patient. The risks and benefits of the procedure were discussed and the patient's questions were addressed. Informed consent was obtained from the patient. The patient was placed prone on CT table. Images of the pelvis were obtained. The right side of back was prepped and draped in sterile fashion. The skin and right posterior ilium were anesthetized with 1% lidocaine . 11 gauge bone needle was directed into the right ilium with CT guidance. Two aspirates and 2 core biopsies were obtained. Bandage placed over the puncture site. FINDINGS: Biopsy needle directed into posterior right ilium. IMPRESSION: CT guided bone marrow aspiration and core biopsy. Electronically Signed   By: Elene Griffes M.D.   On:  05/14/2023 15:24    ASSESSMENT & PLAN:   68 y.o. male with:  Lytic lesion on CT scan in sternal manubrium C/W plasmacytoma on Biopsy. Newly diagnosed B12 deficiency.  PLAN:  -his lytic bone lesion biopsy in the sternum on 05/14/2023 showed findings of plasmacytoma.  -re-discussed finding of hypermetabolic lytic lesion within the sternal manubrium from PET scan in March -There are no other obvious bone tumors on his PET scan. -kappa free light chains elevated at 146.6 based on lab from two months ago  -discussed that the bone marrow shows some increase in plasma cells between 5-9%. Educated patient that if the increase in plasma cells is clearly more than 10%, then it would also be called multiple myeloma in addition to the plasmacytoma.  -patient does not meet definitive criteria for multiple myeloma at this time -his increase in plasma cells is not limiting his blood counts or causing kidney issues or new elevated calcium levles -discussed two options: Treat his condition as myeloma, which would typically mean treating the entire body. Discussed that treatment would not be curative, but would control his remaining disease. Discussed that treatment could potentially be more bothersome than the disease itself. Discussed that once treatment is initiated, it would be continued long term.  If we assume there is only concern for isolated plasmacytoma, we would treat his tumor with radiation therapy.  -recommend proceeding conservative approach and consulting with radiation oncologist for consideration of localized radiation therapy to treat the small lesion in his sternum to prevent bone fracture if it continues to grow. -discussed that low-level radiation is generally more than 90% effective.  -will place referral  to radiation oncology -given that there are no other lesions from disease, and he has not bothersome symptoms, it would be reasonable to monitor his disease without additional treatment  besides radiation therapy at this time. -discussed that reasons to consider additional treatment would include drop in blood counts, increase in calcium, kidney changes, or bone tumors.  -discussed the risk profile for his condition is based on genetics -discussed that staging for his condition would be dependent on albumin levels and genetics -mutation profile for myeloma is pending at this time -discussed that staging of his condition would not necessarily have a bearing on treatment -discussed that sometimes myeloma can be in a smoldering stage where it can stay stable for years sometimes. If this remains stable, we would watch it every several months with blood tests.  -educated patient and his daughter that genetic testing would provide insight into additional parameters for choosing treatment and provides an indirect sense of how his condition may behave -will review pending labs  -counseled patient on smoking cession -discussed that smoking can increase the risk of blood clots and cancer -recommend optimizing nutrition, staying physically active, and cutting down on smoking to improve his overall health  -answered all of patient's and his daughter's questions in detail -patient shall return to clinic in 6-8 weeks   FOLLOW-UP: Referral to radiation oncology for plasmacytoma on sternum Labs in 7 weeks RTC with Dr Salomon Cree in 8 weeks  The total time spent in the appointment was 40 minutes* .  All of the patient's questions were answered with apparent satisfaction. The patient knows to call the clinic with any problems, questions or concerns.   Jacquelyn Matt MD MS AAHIVMS [facility] Hematology/Oncology Physician [facility]  .*Total Encounter Time as defined by the Centers for Medicare and Medicaid Services includes, in addition to the face-to-face time of a patient visit (documented in the note above) non-face-to-face time: obtaining and reviewing outside history, ordering and  reviewing medications, tests or procedures, care coordination (communications with other health care professionals or caregivers) and documentation in the medical record.    I,Mitra Faeizi,acting as a Neurosurgeon for Jacquelyn Matt, MD.,have documented all relevant documentation on the behalf of Jacquelyn Matt, MD,as directed by  Jacquelyn Matt, MD while in the presence of Jacquelyn Matt, MD.  .I have reviewed the above documentation for accuracy and completeness, and I agree with the above. .Callahan Wild Kishore Benett Swoyer MD

## 2023-05-27 NOTE — Progress Notes (Signed)
 Histology and Location of Primary Cancer: Plasmacytoma of Sternum  Location(s) of Symptomatic Metastases: Sternal Manubrium   PET 03/25/2023:  The expansile lytic lesion within the sternal manubrium is hypermetabolic and suspicious for neoplasm. Differential includes plasmacytoma, metastatic disease and primary osseous lesion.  No other hypermetabolic osseous lesions identified.  No evidence of extraosseous metastatic disease.   Biopsy 05/14/2023      Past/Anticipated chemotherapy by medical oncology, if any:  Dr. Salomon Cree  - 04/09/2023 -Discussed that the lytic lesion in the sternal manubrium spot is active to some degree. -discussed option to receive a biopsy of the bone then the bone marrow next, or to to receive both biopsies of the bone and bone marrow at the same time. Discussed that biopsies would help to determine whether there may be concern for plasmacytoma with isolated lesion or multiple myeloma with bone marrow involvement.  -will set up bone and bone marrow biopsy at the same time   -discussed that if there is localized plasmacytoma and no other bone lesions on his PET scan, treatment may only involve local radiation. If there is more broad disease, there may still be a role for only radiation therapy and monitoring remaining disease.     Pain on a scale of 0-10 is:  No complaints of pain   Ambulatory status? Walker? Wheelchair?: Ambulatory with a walker, uses a wheelchair for longer distances.  SAFETY ISSUES: Prior radiation? No Pacemaker/ICD? No Possible current pregnancy? N/a Is the patient on methotrexate? No  Current Complaints / other details:   Lives at home, has caretaker 6-7 hours per day.  -Schedule all appts with his daughter.  -Alcohol induced dementia

## 2023-05-28 ENCOUNTER — Ambulatory Visit
Admission: RE | Admit: 2023-05-28 | Discharge: 2023-05-28 | Disposition: A | Discharge status: Home / Self Care | Source: Ambulatory Visit | Attending: Radiation Oncology | Admitting: Radiation Oncology

## 2023-05-28 DIAGNOSIS — C9030 Solitary plasmacytoma not having achieved remission: Secondary | ICD-10-CM

## 2023-05-28 NOTE — Progress Notes (Signed)
 Radiation Oncology         [phone] ________________________________  Name: Austin [name]        MRN: [id number]  Date of Service: 05/28/2023 DOB: Jun 12, 1955  WG:NFAOZ, Heath Litten, MD  Frankie Israel, MD     REFERRING PHYSICIAN: Frankie Israel, MD   DIAGNOSIS: The encounter diagnosis was Plasmacytoma of bone [facility]).  Plasmacytoma of the stomach manubrium  HISTORY OF PRESENT ILLNESS: Austin [name] is a 68 y.o. male seen at the request of Dr. Salomon Cree for lytic sternal plasmacytoma lesion.   Patient originally presented to his CT of the chest for his lung cancer screening.  Chest CT on 02/18/2023 demonstrated a lytic lesion and age-indeterminate pathologic fracture of the manubrium, worrisome for multiple myeloma.  Subsequent PET scan on 03/25/2023 again demonstrated the expansile lytic lesion within the sternal manubrium to be hypermetabolic and suspicious for neoplasm.  No other hypermetabolic osseous lesions or evidence of extraosseous metastatic disease were appreciated.  Biopsy of the lytic lesion showed plasma cell neoplasm with kappa restriction.   Patient met with Dr. Kalo on 05/26/2023 to review his current workup and biopsy results.  They reviewed that his bone marrow showed an increase in plasma cells between 5 to 9%, indicating a plasmacytoma.  He discussed treatment options of treating this as multiple myeloma, systemically versus treating the isolated plasmacytoma with radiation.  The patient ultimately decided to proceed with the conservative approach and was kindly referred to us  today to discuss radiation treatment options.   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  She provides the history.    PREVIOUS RADIATION THERAPY: No   PAST MEDICAL HISTORY:  Past Medical History:  Diagnosis Date   Dyspnea    Hip fracture (HCC)    Hypertension    Stroke [facility])         PAST SURGICAL HISTORY: Past Surgical History:  Procedure Laterality Date   COLONOSCOPY N/A 11/05/2017   Procedure: COLONOSCOPY;  Surgeon: Suzette Espy, MD;  Location: [facility];  Service: Endoscopy;  Laterality: N/A;  12:00   HIP PINNING,CANNULATED Right 07/24/2021   Procedure: CANNULATED HIP PINNING;  Surgeon: Darrin Emerald, MD;  Location: [facility] ORS;  Service: Orthopedics;  Laterality: Right;   POLYPECTOMY  11/05/2017   Procedure: POLYPECTOMY;  Surgeon: Suzette Espy, MD;  Location: [facility];  Service: Endoscopy;;  colon     FAMILY HISTORY:  Family History  Problem Relation Age of Onset   Kidney disease Mother    Hypertension Mother    Cancer Mother    Hypertension Father    Atrial fibrillation Father    Heart attack Father    Heart failure Other    Cancer Other      SOCIAL HISTORY:  reports that he has been smoking cigarettes. He has a 86 pack-year smoking history. He has never used smokeless tobacco. He reports current alcohol use. He reports current drug use. Drug: Marijuana.  Patient with independently, he does have a home health nurse with him Sunday through Friday.   ALLERGIES: Fish allergy   MEDICATIONS:  Current Outpatient Medications  Medication Sig Dispense Refill   amLODipine  (NORVASC ) 10 MG tablet Take 1 tablet (10 mg total) by mouth daily. 90 tablet 1   olmesartan  (BENICAR ) 20 MG tablet Take 1 tablet (20 mg total) by mouth daily. 60 tablet 0   No current facility-administered medications  for this encounter.     REVIEW OF SYSTEMS: On review of systems, the patient's daughter reports that he is doing well overall. He denies any chest pain, shortness of breath, heartburn, or difficulty with swallowing.  Patient is ambulatory with a walker, and uses a wheelchair for longer distances.      PHYSICAL EXAM:  Deferred, due to nature of MyChart visit.   ECOG = 0  0 - Asymptomatic (Fully active, able to carry on all predisease activities  without restriction)  1 - Symptomatic but completely ambulatory (Restricted in physically strenuous activity but ambulatory and able to carry out work of a light or sedentary nature. For example, light housework, office work)  2 - Symptomatic, <50% in bed during the day (Ambulatory and capable of all self care but unable to carry out any work activities. Up and about more than 50% of waking hours)  3 - Symptomatic, >50% in bed, but not bedbound (Capable of only limited self-care, confined to bed or chair 50% or more of waking hours)  4 - Bedbound (Completely disabled. Cannot carry on any self-care. Totally confined to bed or chair)  5 - Death   Aurea Blossom MM, Creech RH, Tormey DC, et al. [phone]). "Toxicity and response criteria of the [facility]". Am. Hillard Lowes. Oncol. 5 (6): 649-55    LABORATORY DATA:  Lab Results  Component Value Date   WBC 10.2 05/14/2023   HGB 14.1 05/14/2023   HCT 43.3 05/14/2023   MCV 90.8 05/14/2023   PLT 311 05/14/2023   Lab Results  Component Value Date   NA 144 03/30/2023   K 3.9 03/30/2023   CL 110 03/30/2023   CO2 28 03/30/2023   Lab Results  Component Value Date   ALT 6 03/30/2023   AST 12 (L) 03/30/2023   ALKPHOS 56 03/30/2023   BILITOT 0.4 03/30/2023      RADIOGRAPHY: CT BIOPSY Result Date: 05/14/2023 INDICATION: 68 year old with a lytic lesion involving the sternal manubrium. Procedure was performed immediately following the CT-guided bone marrow biopsy. EXAM: CT-GUIDED BIOPSY OF LYTIC BONE LESION IN THE MANUBRIUM Physician: Olive Better. Julietta Ogren, MD MEDICATIONS: Fentanyl  25 mcg ANESTHESIA/SEDATION: Patient's level of consciousness and vital signs were monitored continuously by radiology nurse throughout the procedure under the supervision of the provider performing the procedure. COMPLICATIONS: None immediate. PROCEDURE: Patient was placed supine. CT images of the chest were obtained. Lytic lesion involving the sternal manubrium was  identified and targeted. The right anterior chest was prepped with chlorhexidine  and sterile field was created. Skin was anesthetized using 1% lidocaine . Small incision was made. Using CT guidance, 17 gauge coaxial needle was easily directed through the anterior cortex into the lucent portion of the lesion. Core biopsies were obtained with an 18 gauge device. Specimens placed in formalin. 17 gauge coaxial needle was removed without complication. Bandage placed over the puncture site. FINDINGS: Mildly expansile lytic lesion involving the sternal manubrium. Biopsy needle confirmed within the lytic lesion. IMPRESSION: CT-guided core biopsies of the lytic lesion involving the manubrium. Electronically Signed   By: Elene Griffes M.D.   On: 05/14/2023 16:52   CT BONE MARROW BIOPSY & ASPIRATION Result Date: 05/14/2023 INDICATION: 68 year old with a lytic sternal lesion and light chain disease, kappa type. Request for bone marrow biopsy. EXAM: CT GUIDED BONE MARROW ASPIRATES AND BIOPSY Physician: Olive Better. Henn, MD MEDICATIONS: Moderate sedation ANESTHESIA/SEDATION: Moderate (conscious) sedation was employed during this procedure. A total of Versed  2 mg and fentanyl  100 mcg  was administered intravenously at the order of the provider performing the procedure. Total intra-service moderate sedation time: 13 minutes. Patient's level of consciousness and vital signs were monitored continuously by radiology nurse throughout the procedure under the supervision of the provider performing the procedure. COMPLICATIONS: None immediate. PROCEDURE: The procedure was explained to the patient. The risks and benefits of the procedure were discussed and the patient's questions were addressed. Informed consent was obtained from the patient. The patient was placed prone on CT table. Images of the pelvis were obtained. The right side of back was prepped and draped in sterile fashion. The skin and right posterior ilium were anesthetized with 1%  lidocaine . 11 gauge bone needle was directed into the right ilium with CT guidance. Two aspirates and 2 core biopsies were obtained. Bandage placed over the puncture site. FINDINGS: Biopsy needle directed into posterior right ilium. IMPRESSION: CT guided bone marrow aspiration and core biopsy. Electronically Signed   By: Elene Griffes M.D.   On: 05/14/2023 15:24       IMPRESSION/PLAN: 1. Plasmacytoma of the sternal manubrium  We have reviewed the patient's current workup. PET imaging demonstrates an expansile lytic lesion within the sternal manubrium, no other evidence of osseous or extraosseous metastatic disease. After discussion with Dr. Salomon Cree, patient has decided to proceed with treatment isolated to the plasmacytoma. Dr. Jeryl Moris recommends a 5-week course of radiation treatment to treat the sternal plasmacytoma.  Today, I talked to the patient and family about the findings and work-up thus far.  We discussed the natural history of plasmacytomas and general treatment, highlighting the role of radiotherapy in the management.  We discussed the available radiation techniques, and focused on the details of logistics and delivery.  We reviewed the anticipated acute and late sequelae associated with radiation in this setting.  The patient was encouraged to ask questions that I answered to the best of my ability. A patient consent form was discussed and signed.  We retained a copy for our records.  The patient would like to proceed with radiation and will be scheduled for CT simulation.   In a visit lasting 60 minutes, greater than 50% of the time was spent face to face discussing the patient's condition, in preparation for the discussion, and coordinating the patient's care.   The above documentation reflects my direct findings during this shared patient visit. Please see the separate note by Dr. Jeryl Moris on this date for the remainder of the patient's plan of care.    Julio Ohm, PA-C    **Disclaimer:  This note was dictated with voice recognition software. Similar sounding words can inadvertently be transcribed and this note may contain transcription errors which may not have been corrected upon publication of note.**

## 2023-06-01 ENCOUNTER — Telehealth: Payer: Self-pay | Admitting: Internal Medicine

## 2023-06-01 DIAGNOSIS — C9030 Solitary plasmacytoma not having achieved remission: Secondary | ICD-10-CM | POA: Diagnosis not present

## 2023-06-01 NOTE — Telephone Encounter (Signed)
 PCS forms  Noted Copied Sleeved  Placed in PCP box  Copy in folder at front desk

## 2023-06-01 NOTE — Telephone Encounter (Signed)
 Form filled out, in provider folder for signature

## 2023-06-02 ENCOUNTER — Encounter (HOSPITAL_COMMUNITY): Payer: Self-pay

## 2023-06-02 DIAGNOSIS — Z0279 Encounter for issue of other medical certificate: Secondary | ICD-10-CM

## 2023-06-02 NOTE — Telephone Encounter (Signed)
 Placed in yosseline box to be faxed

## 2023-06-03 NOTE — Telephone Encounter (Signed)
 Faxed to [phone] with confirmation 5/29 8:14AM  Paperwork sent to scans

## 2023-06-04 ENCOUNTER — Ambulatory Visit
Admission: RE | Admit: 2023-06-04 | Discharge: 2023-06-04 | Disposition: A | Discharge status: Home / Self Care | Source: Ambulatory Visit | Attending: Radiation Oncology | Admitting: Radiation Oncology

## 2023-06-04 DIAGNOSIS — Z510 Encounter for antineoplastic radiation therapy: Secondary | ICD-10-CM | POA: Diagnosis not present

## 2023-06-04 DIAGNOSIS — C9030 Solitary plasmacytoma not having achieved remission: Secondary | ICD-10-CM | POA: Insufficient documentation

## 2023-06-14 ENCOUNTER — Telehealth: Payer: Self-pay | Admitting: Radiology

## 2023-06-14 ENCOUNTER — Ambulatory Visit: Admitting: Radiation Oncology

## 2023-06-14 DIAGNOSIS — C9030 Solitary plasmacytoma not having achieved remission: Secondary | ICD-10-CM | POA: Diagnosis not present

## 2023-06-15 ENCOUNTER — Telehealth: Payer: Self-pay | Admitting: *Deleted

## 2023-06-15 ENCOUNTER — Encounter: Payer: Self-pay | Admitting: *Deleted

## 2023-06-15 ENCOUNTER — Ambulatory Visit

## 2023-06-15 NOTE — Telephone Encounter (Signed)
 Transportation Exception Form  * If the practice/clinic is more than 25 miles from the patient's address, please provide the following information:    Patients Name Austin [name] Up Date   Patient DOB November 23, 1955 Practice Location   Patient MRN [id number]    Patient Home Address [address] [place] [address]      Description of Clinical Conditions Necessitating Transport   Patient lives on his own with dementia and unable to drive himself. He does not have social support to drive him. The treatment is essential to support their health and well-being and has been determined to be clinically indicated based on current medical standards.       Electronic Signature of Requester: MAURY GRONINGER  DATE:  06/15/2023

## 2023-06-16 ENCOUNTER — Ambulatory Visit: Admitting: Radiation Oncology

## 2023-06-17 ENCOUNTER — Ambulatory Visit
Admission: RE | Admit: 2023-06-17 | Discharge: 2023-06-17 | Disposition: A | Discharge status: Home / Self Care | Source: Ambulatory Visit | Attending: Radiation Oncology | Admitting: Radiation Oncology

## 2023-06-17 ENCOUNTER — Other Ambulatory Visit: Payer: Self-pay

## 2023-06-17 ENCOUNTER — Encounter

## 2023-06-17 DIAGNOSIS — C9030 Solitary plasmacytoma not having achieved remission: Secondary | ICD-10-CM | POA: Diagnosis not present

## 2023-06-17 LAB — RAD ONC ARIA SESSION SUMMARY
Course Elapsed Days: 0
Plan Fractions Treated to Date: 1
Plan Prescribed Dose Per Fraction: 2 Gy
Plan Total Fractions Prescribed: 25
Plan Total Prescribed Dose: 50 Gy
Reference Point Dosage Given to Date: 2 Gy
Reference Point Session Dosage Given: 2 Gy
Session Number: 1

## 2023-06-18 ENCOUNTER — Other Ambulatory Visit: Payer: Self-pay

## 2023-06-18 ENCOUNTER — Inpatient Hospital Stay: Attending: Hematology

## 2023-06-18 ENCOUNTER — Ambulatory Visit
Admission: RE | Admit: 2023-06-18 | Discharge: 2023-06-18 | Disposition: A | Discharge status: Home / Self Care | Source: Ambulatory Visit | Attending: Radiation Oncology | Admitting: Radiation Oncology

## 2023-06-18 ENCOUNTER — Ambulatory Visit
Admission: RE | Admit: 2023-06-18 | Discharge: 2023-06-18 | Disposition: A | Discharge status: Home / Self Care | Source: Ambulatory Visit | Attending: Radiation Oncology

## 2023-06-18 DIAGNOSIS — C9030 Solitary plasmacytoma not having achieved remission: Secondary | ICD-10-CM

## 2023-06-18 LAB — RAD ONC ARIA SESSION SUMMARY
Course Elapsed Days: 1
Plan Fractions Treated to Date: 2
Plan Prescribed Dose Per Fraction: 2 Gy
Plan Total Fractions Prescribed: 25
Plan Total Prescribed Dose: 50 Gy
Reference Point Dosage Given to Date: 4 Gy
Reference Point Session Dosage Given: 2 Gy
Session Number: 2

## 2023-06-18 MED ORDER — RADIAPLEXRX EX GEL: Freq: Once | CUTANEOUS | Status: AC

## 2023-06-21 ENCOUNTER — Other Ambulatory Visit: Payer: Self-pay

## 2023-06-21 ENCOUNTER — Inpatient Hospital Stay

## 2023-06-21 ENCOUNTER — Ambulatory Visit
Admission: RE | Admit: 2023-06-21 | Discharge: 2023-06-21 | Disposition: A | Discharge status: Home / Self Care | Source: Ambulatory Visit | Attending: Radiation Oncology

## 2023-06-21 DIAGNOSIS — C9030 Solitary plasmacytoma not having achieved remission: Secondary | ICD-10-CM | POA: Diagnosis not present

## 2023-06-21 LAB — RAD ONC ARIA SESSION SUMMARY
Course Elapsed Days: 4
Plan Fractions Treated to Date: 3
Plan Prescribed Dose Per Fraction: 2 Gy
Plan Total Fractions Prescribed: 25
Plan Total Prescribed Dose: 50 Gy
Reference Point Dosage Given to Date: 6 Gy
Reference Point Session Dosage Given: 2 Gy
Session Number: 3

## 2023-06-22 ENCOUNTER — Other Ambulatory Visit: Payer: Self-pay

## 2023-06-22 ENCOUNTER — Ambulatory Visit
Admission: RE | Admit: 2023-06-22 | Discharge: 2023-06-22 | Disposition: A | Discharge status: Home / Self Care | Source: Ambulatory Visit | Attending: Radiation Oncology | Admitting: Radiation Oncology

## 2023-06-22 ENCOUNTER — Inpatient Hospital Stay

## 2023-06-22 DIAGNOSIS — C9030 Solitary plasmacytoma not having achieved remission: Secondary | ICD-10-CM | POA: Diagnosis not present

## 2023-06-22 LAB — RAD ONC ARIA SESSION SUMMARY
Course Elapsed Days: 5
Plan Fractions Treated to Date: 4
Plan Prescribed Dose Per Fraction: 2 Gy
Plan Total Fractions Prescribed: 25
Plan Total Prescribed Dose: 50 Gy
Reference Point Dosage Given to Date: 8 Gy
Reference Point Session Dosage Given: 2 Gy
Session Number: 4

## 2023-06-23 ENCOUNTER — Other Ambulatory Visit: Payer: Self-pay

## 2023-06-23 ENCOUNTER — Ambulatory Visit
Admission: RE | Admit: 2023-06-23 | Discharge: 2023-06-23 | Disposition: A | Discharge status: Home / Self Care | Source: Ambulatory Visit | Attending: Radiation Oncology

## 2023-06-23 ENCOUNTER — Inpatient Hospital Stay

## 2023-06-23 DIAGNOSIS — Z510 Encounter for antineoplastic radiation therapy: Secondary | ICD-10-CM | POA: Diagnosis not present

## 2023-06-23 DIAGNOSIS — C9030 Solitary plasmacytoma not having achieved remission: Secondary | ICD-10-CM | POA: Diagnosis not present

## 2023-06-23 LAB — RAD ONC ARIA SESSION SUMMARY
Course Elapsed Days: 6
Plan Fractions Treated to Date: 5
Plan Prescribed Dose Per Fraction: 2 Gy
Plan Total Fractions Prescribed: 25
Plan Total Prescribed Dose: 50 Gy
Reference Point Dosage Given to Date: 10 Gy
Reference Point Session Dosage Given: 2 Gy
Session Number: 5

## 2023-06-24 ENCOUNTER — Inpatient Hospital Stay

## 2023-06-24 ENCOUNTER — Ambulatory Visit
Admission: RE | Admit: 2023-06-24 | Discharge: 2023-06-24 | Disposition: A | Discharge status: Home / Self Care | Source: Ambulatory Visit | Attending: Radiation Oncology | Admitting: Radiation Oncology

## 2023-06-24 ENCOUNTER — Other Ambulatory Visit: Payer: Self-pay

## 2023-06-24 DIAGNOSIS — C9030 Solitary plasmacytoma not having achieved remission: Secondary | ICD-10-CM | POA: Diagnosis not present

## 2023-06-24 LAB — RAD ONC ARIA SESSION SUMMARY
Course Elapsed Days: 7
Plan Fractions Treated to Date: 6
Plan Prescribed Dose Per Fraction: 2 Gy
Plan Total Fractions Prescribed: 25
Plan Total Prescribed Dose: 50 Gy
Reference Point Dosage Given to Date: 12 Gy
Reference Point Session Dosage Given: 2 Gy
Session Number: 6

## 2023-06-25 ENCOUNTER — Other Ambulatory Visit: Payer: Self-pay

## 2023-06-25 ENCOUNTER — Ambulatory Visit
Admission: RE | Admit: 2023-06-25 | Discharge: 2023-06-25 | Disposition: A | Discharge status: Home / Self Care | Source: Ambulatory Visit | Attending: Radiation Oncology | Admitting: Radiation Oncology

## 2023-06-25 ENCOUNTER — Encounter: Payer: Self-pay | Admitting: Radiation Oncology

## 2023-06-25 ENCOUNTER — Inpatient Hospital Stay

## 2023-06-25 DIAGNOSIS — C9030 Solitary plasmacytoma not having achieved remission: Secondary | ICD-10-CM | POA: Diagnosis not present

## 2023-06-25 LAB — RAD ONC ARIA SESSION SUMMARY
Course Elapsed Days: 8
Plan Fractions Treated to Date: 7
Plan Prescribed Dose Per Fraction: 2 Gy
Plan Total Fractions Prescribed: 25
Plan Total Prescribed Dose: 50 Gy
Reference Point Dosage Given to Date: 14 Gy
Reference Point Session Dosage Given: 2 Gy
Session Number: 7

## 2023-06-25 NOTE — Progress Notes (Signed)
 Returned call to patient's daughter Jyl Or from voicemail left while on [place].  Introduced myself as Dance movement psychotherapist and to offer available resources. Discussed one-time $1000 Marketing executive to assist with personal expenses such as gas cards while going through treatment. Advised what is needed to apply and how to submit. She verbalized understanding. Briefly discussed expenses and how they are covered. After receiving documents, they will be able to complete grant paperwork and be given a copy of approval letter and expense sheet to review and discuss with me at earliest convenience.  She will have my card to do so and for any additional financial questions or concerns.

## 2023-06-28 ENCOUNTER — Ambulatory Visit

## 2023-06-28 ENCOUNTER — Inpatient Hospital Stay

## 2023-06-29 ENCOUNTER — Inpatient Hospital Stay

## 2023-06-29 ENCOUNTER — Other Ambulatory Visit: Payer: Self-pay

## 2023-06-29 ENCOUNTER — Ambulatory Visit
Admission: RE | Admit: 2023-06-29 | Discharge: 2023-06-29 | Disposition: A | Discharge status: Home / Self Care | Source: Ambulatory Visit | Attending: Radiation Oncology

## 2023-06-29 DIAGNOSIS — C9030 Solitary plasmacytoma not having achieved remission: Secondary | ICD-10-CM | POA: Diagnosis not present

## 2023-06-29 LAB — RAD ONC ARIA SESSION SUMMARY
Course Elapsed Days: 12
Plan Fractions Treated to Date: 8
Plan Prescribed Dose Per Fraction: 2 Gy
Plan Total Fractions Prescribed: 25
Plan Total Prescribed Dose: 50 Gy
Reference Point Dosage Given to Date: 16 Gy
Reference Point Session Dosage Given: 2 Gy
Session Number: 8

## 2023-06-30 ENCOUNTER — Inpatient Hospital Stay

## 2023-06-30 ENCOUNTER — Other Ambulatory Visit: Payer: Self-pay

## 2023-06-30 ENCOUNTER — Ambulatory Visit
Admission: RE | Admit: 2023-06-30 | Discharge: 2023-06-30 | Disposition: A | Discharge status: Home / Self Care | Source: Ambulatory Visit | Attending: Radiation Oncology

## 2023-06-30 DIAGNOSIS — C9030 Solitary plasmacytoma not having achieved remission: Secondary | ICD-10-CM | POA: Diagnosis not present

## 2023-06-30 LAB — RAD ONC ARIA SESSION SUMMARY
Course Elapsed Days: 13
Plan Fractions Treated to Date: 9
Plan Prescribed Dose Per Fraction: 2 Gy
Plan Total Fractions Prescribed: 25
Plan Total Prescribed Dose: 50 Gy
Reference Point Dosage Given to Date: 18 Gy
Reference Point Session Dosage Given: 2 Gy
Session Number: 9

## 2023-07-01 ENCOUNTER — Other Ambulatory Visit: Payer: Self-pay

## 2023-07-01 ENCOUNTER — Inpatient Hospital Stay

## 2023-07-01 ENCOUNTER — Ambulatory Visit
Admission: RE | Admit: 2023-07-01 | Discharge: 2023-07-01 | Disposition: A | Discharge status: Home / Self Care | Source: Ambulatory Visit | Attending: Radiation Oncology | Admitting: Radiation Oncology

## 2023-07-01 DIAGNOSIS — Z510 Encounter for antineoplastic radiation therapy: Secondary | ICD-10-CM | POA: Diagnosis not present

## 2023-07-01 DIAGNOSIS — C9030 Solitary plasmacytoma not having achieved remission: Secondary | ICD-10-CM | POA: Diagnosis not present

## 2023-07-01 LAB — RAD ONC ARIA SESSION SUMMARY
Course Elapsed Days: 14
Plan Fractions Treated to Date: 10
Plan Prescribed Dose Per Fraction: 2 Gy
Plan Total Fractions Prescribed: 25
Plan Total Prescribed Dose: 50 Gy
Reference Point Dosage Given to Date: 20 Gy
Reference Point Session Dosage Given: 2 Gy
Session Number: 10

## 2023-07-02 ENCOUNTER — Inpatient Hospital Stay

## 2023-07-02 ENCOUNTER — Ambulatory Visit
Admission: RE | Admit: 2023-07-02 | Discharge: 2023-07-02 | Disposition: A | Discharge status: Home / Self Care | Source: Ambulatory Visit | Attending: Radiation Oncology

## 2023-07-02 ENCOUNTER — Ambulatory Visit

## 2023-07-02 ENCOUNTER — Other Ambulatory Visit: Payer: Self-pay

## 2023-07-02 DIAGNOSIS — C9030 Solitary plasmacytoma not having achieved remission: Secondary | ICD-10-CM | POA: Diagnosis not present

## 2023-07-02 LAB — RAD ONC ARIA SESSION SUMMARY
Course Elapsed Days: 15
Plan Fractions Treated to Date: 11
Plan Prescribed Dose Per Fraction: 2 Gy
Plan Total Fractions Prescribed: 25
Plan Total Prescribed Dose: 50 Gy
Reference Point Dosage Given to Date: 22 Gy
Reference Point Session Dosage Given: 2 Gy
Session Number: 11

## 2023-07-05 ENCOUNTER — Inpatient Hospital Stay

## 2023-07-05 ENCOUNTER — Ambulatory Visit
Admission: RE | Admit: 2023-07-05 | Discharge: 2023-07-05 | Disposition: A | Discharge status: Home / Self Care | Source: Ambulatory Visit | Attending: Radiation Oncology

## 2023-07-05 ENCOUNTER — Other Ambulatory Visit: Payer: Self-pay

## 2023-07-05 ENCOUNTER — Ambulatory Visit
Admission: RE | Admit: 2023-07-05 | Discharge: 2023-07-05 | Disposition: A | Discharge status: Home / Self Care | Source: Ambulatory Visit | Attending: Radiation Oncology | Admitting: Radiation Oncology

## 2023-07-05 DIAGNOSIS — C9030 Solitary plasmacytoma not having achieved remission: Secondary | ICD-10-CM | POA: Diagnosis not present

## 2023-07-05 LAB — RAD ONC ARIA SESSION SUMMARY
Course Elapsed Days: 18
Plan Fractions Treated to Date: 12
Plan Prescribed Dose Per Fraction: 2 Gy
Plan Total Fractions Prescribed: 25
Plan Total Prescribed Dose: 50 Gy
Reference Point Dosage Given to Date: 24 Gy
Reference Point Session Dosage Given: 2 Gy
Session Number: 12

## 2023-07-06 ENCOUNTER — Other Ambulatory Visit: Payer: Self-pay

## 2023-07-06 ENCOUNTER — Ambulatory Visit
Admission: RE | Admit: 2023-07-06 | Discharge: 2023-07-06 | Disposition: A | Discharge status: Home / Self Care | Source: Ambulatory Visit | Attending: Radiation Oncology | Admitting: Radiation Oncology

## 2023-07-06 DIAGNOSIS — C9030 Solitary plasmacytoma not having achieved remission: Secondary | ICD-10-CM | POA: Diagnosis present

## 2023-07-06 LAB — RAD ONC ARIA SESSION SUMMARY
Course Elapsed Days: 19
Plan Fractions Treated to Date: 13
Plan Prescribed Dose Per Fraction: 2 Gy
Plan Total Fractions Prescribed: 25
Plan Total Prescribed Dose: 50 Gy
Reference Point Dosage Given to Date: 26 Gy
Reference Point Session Dosage Given: 2 Gy
Session Number: 13

## 2023-07-07 ENCOUNTER — Other Ambulatory Visit: Payer: Self-pay

## 2023-07-07 ENCOUNTER — Ambulatory Visit
Admission: RE | Admit: 2023-07-07 | Discharge: 2023-07-07 | Disposition: A | Discharge status: Home / Self Care | Source: Ambulatory Visit | Attending: Radiation Oncology | Admitting: Radiation Oncology

## 2023-07-07 DIAGNOSIS — C9030 Solitary plasmacytoma not having achieved remission: Secondary | ICD-10-CM | POA: Diagnosis not present

## 2023-07-07 LAB — RAD ONC ARIA SESSION SUMMARY
Course Elapsed Days: 20
Plan Fractions Treated to Date: 14
Plan Prescribed Dose Per Fraction: 2 Gy
Plan Total Fractions Prescribed: 25
Plan Total Prescribed Dose: 50 Gy
Reference Point Dosage Given to Date: 28 Gy
Reference Point Session Dosage Given: 2 Gy
Session Number: 14

## 2023-07-08 ENCOUNTER — Other Ambulatory Visit: Payer: Self-pay

## 2023-07-08 ENCOUNTER — Ambulatory Visit
Admission: RE | Admit: 2023-07-08 | Discharge: 2023-07-08 | Disposition: A | Discharge status: Home / Self Care | Source: Ambulatory Visit | Attending: Radiation Oncology | Admitting: Radiation Oncology

## 2023-07-08 DIAGNOSIS — C9030 Solitary plasmacytoma not having achieved remission: Secondary | ICD-10-CM | POA: Diagnosis not present

## 2023-07-08 LAB — RAD ONC ARIA SESSION SUMMARY
Course Elapsed Days: 21
Plan Fractions Treated to Date: 15
Plan Prescribed Dose Per Fraction: 2 Gy
Plan Total Fractions Prescribed: 25
Plan Total Prescribed Dose: 50 Gy
Reference Point Dosage Given to Date: 30 Gy
Reference Point Session Dosage Given: 2 Gy
Session Number: 15

## 2023-07-12 ENCOUNTER — Ambulatory Visit
Admission: RE | Admit: 2023-07-12 | Discharge: 2023-07-12 | Disposition: A | Discharge status: Home / Self Care | Source: Ambulatory Visit | Attending: Radiation Oncology | Admitting: Radiation Oncology

## 2023-07-12 ENCOUNTER — Other Ambulatory Visit: Payer: Self-pay

## 2023-07-12 DIAGNOSIS — C9030 Solitary plasmacytoma not having achieved remission: Secondary | ICD-10-CM | POA: Diagnosis not present

## 2023-07-12 LAB — RAD ONC ARIA SESSION SUMMARY
Course Elapsed Days: 25
Plan Fractions Treated to Date: 16
Plan Prescribed Dose Per Fraction: 2 Gy
Plan Total Fractions Prescribed: 25
Plan Total Prescribed Dose: 50 Gy
Reference Point Dosage Given to Date: 32 Gy
Reference Point Session Dosage Given: 2 Gy
Session Number: 16

## 2023-07-13 ENCOUNTER — Ambulatory Visit
Admission: RE | Admit: 2023-07-13 | Discharge: 2023-07-13 | Disposition: A | Discharge status: Home / Self Care | Source: Ambulatory Visit | Attending: Radiation Oncology | Admitting: Radiation Oncology

## 2023-07-13 ENCOUNTER — Other Ambulatory Visit: Payer: Self-pay

## 2023-07-13 DIAGNOSIS — C9030 Solitary plasmacytoma not having achieved remission: Secondary | ICD-10-CM | POA: Diagnosis not present

## 2023-07-13 LAB — RAD ONC ARIA SESSION SUMMARY
Course Elapsed Days: 26
Plan Fractions Treated to Date: 17
Plan Prescribed Dose Per Fraction: 2 Gy
Plan Total Fractions Prescribed: 25
Plan Total Prescribed Dose: 50 Gy
Reference Point Dosage Given to Date: 34 Gy
Reference Point Session Dosage Given: 2 Gy
Session Number: 17

## 2023-07-14 ENCOUNTER — Ambulatory Visit
Admission: RE | Admit: 2023-07-14 | Discharge: 2023-07-14 | Disposition: A | Discharge status: Home / Self Care | Source: Ambulatory Visit | Attending: Radiation Oncology | Admitting: Radiation Oncology

## 2023-07-14 ENCOUNTER — Other Ambulatory Visit: Payer: Self-pay

## 2023-07-14 DIAGNOSIS — C9030 Solitary plasmacytoma not having achieved remission: Secondary | ICD-10-CM | POA: Diagnosis not present

## 2023-07-14 LAB — RAD ONC ARIA SESSION SUMMARY
Course Elapsed Days: 27
Plan Fractions Treated to Date: 18
Plan Prescribed Dose Per Fraction: 2 Gy
Plan Total Fractions Prescribed: 25
Plan Total Prescribed Dose: 50 Gy
Reference Point Dosage Given to Date: 36 Gy
Reference Point Session Dosage Given: 2 Gy
Session Number: 18

## 2023-07-15 ENCOUNTER — Other Ambulatory Visit: Payer: Self-pay

## 2023-07-15 ENCOUNTER — Ambulatory Visit
Admission: RE | Admit: 2023-07-15 | Discharge: 2023-07-15 | Disposition: A | Discharge status: Home / Self Care | Source: Ambulatory Visit | Attending: Radiation Oncology | Admitting: Radiation Oncology

## 2023-07-15 ENCOUNTER — Inpatient Hospital Stay: Attending: Hematology

## 2023-07-15 DIAGNOSIS — C9030 Solitary plasmacytoma not having achieved remission: Secondary | ICD-10-CM | POA: Diagnosis not present

## 2023-07-15 LAB — RAD ONC ARIA SESSION SUMMARY
Course Elapsed Days: 28
Plan Fractions Treated to Date: 19
Plan Prescribed Dose Per Fraction: 2 Gy
Plan Total Fractions Prescribed: 25
Plan Total Prescribed Dose: 50 Gy
Reference Point Dosage Given to Date: 38 Gy
Reference Point Session Dosage Given: 2 Gy
Session Number: 19

## 2023-07-16 ENCOUNTER — Other Ambulatory Visit: Payer: Self-pay

## 2023-07-16 ENCOUNTER — Ambulatory Visit
Admission: RE | Admit: 2023-07-16 | Discharge: 2023-07-16 | Disposition: A | Discharge status: Home / Self Care | Source: Ambulatory Visit | Attending: Radiation Oncology | Admitting: Radiation Oncology

## 2023-07-16 ENCOUNTER — Ambulatory Visit: Payer: Self-pay

## 2023-07-16 DIAGNOSIS — C9030 Solitary plasmacytoma not having achieved remission: Secondary | ICD-10-CM | POA: Diagnosis not present

## 2023-07-16 LAB — RAD ONC ARIA SESSION SUMMARY
Course Elapsed Days: 29
Plan Fractions Treated to Date: 20
Plan Prescribed Dose Per Fraction: 2 Gy
Plan Total Fractions Prescribed: 25
Plan Total Prescribed Dose: 50 Gy
Reference Point Dosage Given to Date: 40 Gy
Reference Point Session Dosage Given: 2 Gy
Session Number: 20

## 2023-07-19 ENCOUNTER — Ambulatory Visit

## 2023-07-20 ENCOUNTER — Ambulatory Visit
Admission: RE | Admit: 2023-07-20 | Discharge: 2023-07-20 | Disposition: A | Discharge status: Home / Self Care | Source: Ambulatory Visit | Attending: Radiation Oncology | Admitting: Radiation Oncology

## 2023-07-20 ENCOUNTER — Ambulatory Visit

## 2023-07-20 ENCOUNTER — Other Ambulatory Visit: Payer: Self-pay

## 2023-07-20 ENCOUNTER — Encounter: Payer: Self-pay | Admitting: Radiation Oncology

## 2023-07-20 DIAGNOSIS — C9030 Solitary plasmacytoma not having achieved remission: Secondary | ICD-10-CM | POA: Diagnosis not present

## 2023-07-20 LAB — RAD ONC ARIA SESSION SUMMARY
Course Elapsed Days: 33
Plan Fractions Treated to Date: 21
Plan Prescribed Dose Per Fraction: 2 Gy
Plan Total Fractions Prescribed: 25
Plan Total Prescribed Dose: 50 Gy
Reference Point Dosage Given to Date: 42 Gy
Reference Point Session Dosage Given: 2 Gy
Session Number: 21

## 2023-07-20 NOTE — Progress Notes (Signed)
 Patient's daughter Annabella called to inquire about [employer] grant expense. Answered questions and concerns she had.  She has my card for any additional financial questions or concerns.

## 2023-07-21 ENCOUNTER — Ambulatory Visit
Admission: RE | Admit: 2023-07-21 | Discharge: 2023-07-21 | Disposition: A | Discharge status: Home / Self Care | Source: Ambulatory Visit | Attending: Radiation Oncology | Admitting: Radiation Oncology

## 2023-07-21 ENCOUNTER — Ambulatory Visit

## 2023-07-21 ENCOUNTER — Other Ambulatory Visit: Payer: Self-pay

## 2023-07-21 DIAGNOSIS — C9030 Solitary plasmacytoma not having achieved remission: Secondary | ICD-10-CM | POA: Diagnosis not present

## 2023-07-21 LAB — RAD ONC ARIA SESSION SUMMARY
Course Elapsed Days: 34
Plan Fractions Treated to Date: 22
Plan Prescribed Dose Per Fraction: 2 Gy
Plan Total Fractions Prescribed: 25
Plan Total Prescribed Dose: 50 Gy
Reference Point Dosage Given to Date: 44 Gy
Reference Point Session Dosage Given: 2 Gy
Session Number: 22

## 2023-07-22 ENCOUNTER — Ambulatory Visit
Admission: RE | Admit: 2023-07-22 | Discharge: 2023-07-22 | Disposition: A | Discharge status: Home / Self Care | Source: Ambulatory Visit | Attending: Radiation Oncology | Admitting: Radiation Oncology

## 2023-07-22 ENCOUNTER — Other Ambulatory Visit: Payer: Self-pay

## 2023-07-22 DIAGNOSIS — C9030 Solitary plasmacytoma not having achieved remission: Secondary | ICD-10-CM | POA: Diagnosis not present

## 2023-07-22 LAB — RAD ONC ARIA SESSION SUMMARY
Course Elapsed Days: 35
Plan Fractions Treated to Date: 23
Plan Prescribed Dose Per Fraction: 2 Gy
Plan Total Fractions Prescribed: 25
Plan Total Prescribed Dose: 50 Gy
Reference Point Dosage Given to Date: 46 Gy
Reference Point Session Dosage Given: 2 Gy
Session Number: 23

## 2023-07-23 ENCOUNTER — Ambulatory Visit

## 2023-07-23 ENCOUNTER — Other Ambulatory Visit: Payer: Self-pay

## 2023-07-23 ENCOUNTER — Ambulatory Visit
Admission: RE | Admit: 2023-07-23 | Discharge: 2023-07-23 | Disposition: A | Discharge status: Home / Self Care | Source: Ambulatory Visit | Attending: Radiation Oncology | Admitting: Radiation Oncology

## 2023-07-23 DIAGNOSIS — C9030 Solitary plasmacytoma not having achieved remission: Secondary | ICD-10-CM | POA: Diagnosis not present

## 2023-07-23 LAB — RAD ONC ARIA SESSION SUMMARY
Course Elapsed Days: 36
Plan Fractions Treated to Date: 24
Plan Prescribed Dose Per Fraction: 2 Gy
Plan Total Fractions Prescribed: 25
Plan Total Prescribed Dose: 50 Gy
Reference Point Dosage Given to Date: 48 Gy
Reference Point Session Dosage Given: 2 Gy
Session Number: 24

## 2023-07-26 ENCOUNTER — Ambulatory Visit
Admission: RE | Admit: 2023-07-26 | Discharge: 2023-07-26 | Disposition: A | Discharge status: Home / Self Care | Source: Ambulatory Visit | Attending: Radiation Oncology | Admitting: Radiation Oncology

## 2023-07-26 ENCOUNTER — Other Ambulatory Visit: Payer: Self-pay

## 2023-07-26 DIAGNOSIS — C9030 Solitary plasmacytoma not having achieved remission: Secondary | ICD-10-CM | POA: Diagnosis not present

## 2023-07-26 LAB — RAD ONC ARIA SESSION SUMMARY
Course Elapsed Days: 39
Plan Fractions Treated to Date: 25
Plan Prescribed Dose Per Fraction: 2 Gy
Plan Total Fractions Prescribed: 25
Plan Total Prescribed Dose: 50 Gy
Reference Point Dosage Given to Date: 50 Gy
Reference Point Session Dosage Given: 2 Gy
Session Number: 25

## 2023-07-27 NOTE — Progress Notes (Signed)
 HEMATOLOGY/ONCOLOGY CLINIC VISIT NOTE  Date of Service: 08/04/23  Patient Care Team: Harvey Margo CROME, MD (Inactive) as Consulting Physician (Gastroenterology) Onesimo Emaline Brink, MD as Consulting Physician (Hematology)  CHIEF COMPLAINTS/PURPOSE OF CONSULTATION:  Lytic bone lesions on Ct scan. -- newly diagnosed plasmacytoma  HISTORY OF PRESENTING ILLNESS:   Austin [name] is a wonderful 68 y.o. male who has been referred to us  by NP Lauraine Lites for evaluation and management of lytic bone lesions on CT scan.   CT Chest Lung CA Screen Low dose W/O CM from 02/18/2023 showed Lytic lesion and age-indeterminate pathologic fracture of the manubrium, worrisome for multiple myeloma.  Patient is accompanied by his daughter during this visit. Patient notes he has been doing well overall without any new or severe medical concerns in the past 50-months. He denies any bone pain, chest pain, or joint pain in the past 31-months. He denies any recent falls or injuries to the chest wall.   He did have a fall in 2014, where he had hip surgery.   He denies any recent infection issues, fever, chills, night sweats, unexpected weight loss, back pain, abdominal pain, new lump/bumps, or leg swelling.   He does complain of balance issues. He uses walking stick and wheel-chair.   Patient currently lives independently and he has a home caretaker. His daughter notes that the current nursing aid is at the patient's house for 6-7 hours. His current home-care facility is [facility]. Patient's daughter is currently in the process to change the home-care facility.   He denies diabetes or lung-disease.  PmHx of COPD, emphysema, hypertension, pericardial effusion, and alcohol-induced dementia. Patient's daughter reports that the patient occasionally has hallucinations due to alcohol-induced dementia. He is currently not getting treated for pericardial effusion.   He currently smokes 2 packs of cigarettes a day.  He used to be a heavy drinker - around 6-12 packs of beer a day (around 4 years ago). Patient's daughter notes he now occasionally drinks 1-2 beer in a month. He does have past history of drug abuse.   Patient did have a PET scan on 03/25/2023, but still waiting on results.  INTERVAL HISTORY:  Austin [name] is a 68 y.o. male who is here for continued evaluation and management of lytic bone lesions. Patient was last seen by me on 05/26/2023 and reported a 4-pound weight loss over 2 months and noted having a general sense of old age.   Patient presents with a walker and is accompanied by his wife. He reports that he has tolerated radiation well. He denies any pain/discomfort over the chest wall, issues swallowing food, new headaches, new SOB, chest pain, new bone pains, change in bowel habits, or change in urination habits.  Patient noted to have some skin changes from radiation therapy which should hopefully heal with time.   He reports that he has limited his alcohol intake. Patient has been eating well and staying well-hydrated. He reports that his weight has been fairly stable.   MEDICAL HISTORY:  Past Medical History:  Diagnosis Date   Dyspnea    Hip fracture (HCC)    Hypertension    Stroke [facility])     SURGICAL HISTORY: Past Surgical History:  Procedure Laterality Date   COLONOSCOPY N/A 11/05/2017   Procedure: COLONOSCOPY;  Surgeon: Shaaron Lamar HERO, MD;  [facility];  Service: Endoscopy;  Laterality: N/A;  12:00   HIP PINNING,CANNULATED Right 07/24/2021   Procedure: CANNULATED HIP PINNING;  Surgeon:  Margrette Taft BRAVO, MD;  Location: [facility];  Service: Orthopedics;  Laterality: Right;   POLYPECTOMY  11/05/2017   Procedure: POLYPECTOMY;  Surgeon: Shaaron Lamar HERO, MD;  Location: [facility];  Service: Endoscopy;;  colon    SOCIAL HISTORY: Social History   Socioeconomic History   Marital status: Legally Separated    Spouse name: Not on file   Number of children: Not  on file   Years of education: Not on file   Highest education level: Not on file  Occupational History   Not on file  Tobacco Use   Smoking status: Every Day    Current packs/day: 2.00    Average packs/day: 2.0 packs/day for 43.0 years (86.0 ttl pk-yrs)    Types: Cigarettes   Smokeless tobacco: Never  Vaping Use   Vaping status: Never Used  Substance and Sexual Activity   Alcohol use: Yes    Comment: beer 2 cans per day   Drug use: Yes    Types: Marijuana   Sexual activity: Yes    Birth control/protection: None  Other Topics Concern   Not on file  Social History Narrative   Not on file   Social Drivers of Health   Financial Resource Strain: Low Risk  (11/19/2022)   Overall Financial Resource Strain (CARDIA)    Difficulty of Paying Living Expenses: Not hard at all  Food Insecurity: No Food Insecurity (04/28/2023)   Hunger Vital Sign    Worried About Running Out of Food in the Last Year: Never true    Ran Out of Food in the Last Year: Never true  Transportation Needs: Unmet Transportation Needs (07/07/2023)   PRAPARE - Administrator, Civil Service (Medical): Yes    Lack of Transportation (Non-Medical): No  Physical Activity: Inactive (11/19/2022)   Exercise Vital Sign    Days of Exercise per Week: 0 days    Minutes of Exercise per Session: 0 min  Stress: No Stress Concern Present (11/19/2022)   [employer] of Occupational Health - Occupational Stress Questionnaire    Feeling of Stress : Not at all  Social Connections: Socially Isolated (11/19/2022)   Social Connection and Isolation Panel    Frequency of Communication with Friends and Family: More than three times a week    Frequency of Social Gatherings with Friends and Family: More than three times a week    Attends Religious Services: Never    Database administrator or Organizations: No    Attends Banker Meetings: Never    Marital Status: Divorced  Catering manager Violence: Not At  Risk (04/28/2023)   Humiliation, Afraid, Rape, and Kick questionnaire    Fear of Current or Ex-Partner: No    Emotionally Abused: No    Physically Abused: No    Sexually Abused: No    FAMILY HISTORY: Family History  Problem Relation Age of Onset   Kidney disease Mother    Hypertension Mother    Cancer Mother    Hypertension Father    Atrial fibrillation Father    Heart attack Father    Heart failure Other    Cancer Other     ALLERGIES:  is allergic to fish allergy.  MEDICATIONS:  Current Outpatient Medications  Medication Sig Dispense Refill   amLODipine  (NORVASC ) 10 MG tablet Take 1 tablet (10 mg total) by mouth daily. 90 tablet 1   olmesartan  (BENICAR ) 20 MG tablet Take 1 tablet (20 mg total) by mouth daily. 60 tablet 0  No current facility-administered medications for this visit.    REVIEW OF SYSTEMS:    10 Point review of Systems was done is negative except as noted above.   PHYSICAL EXAMINATION:  .BP 118/77   Pulse (!) 101   Temp 97.9 F (36.6 C)   Resp 18   Wt 151 lb 14.4 oz (68.9 kg)   SpO2 97%   BMI 22.43 kg/m  GENERAL:alert, in no acute distress and comfortable SKIN: no acute rashes, no significant lesions EYES: conjunctiva are pink and non-injected, sclera anicteric OROPHARYNX: MMM, no exudates, no oropharyngeal erythema or ulceration NECK: supple, no JVD LYMPH:  no palpable lymphadenopathy in the cervical, axillary or inguinal regions LUNGS: clear to auscultation b/l with normal respiratory effort HEART: regular rate & rhythm ABDOMEN:  normoactive bowel sounds , non tender, not distended. Extremity: no pedal edema PSYCH: alert & oriented x 3 with fluent speech NEURO: no focal motor/sensory deficits   LABORATORY DATA:  I have reviewed the data as listed  .    Latest Ref Rng & Units 07/28/2023    2:34 PM 05/14/2023    9:32 AM 03/30/2023   11:49 AM  CBC  WBC 4.0 - 10.5 K/uL 5.0  10.2  11.9   Hemoglobin 13.0 - 17.0 g/dL 86.1  85.8  86.4    Hematocrit 39.0 - 52.0 % 41.7  43.3  41.0   Platelets 150 - 400 K/uL 260  311  331     .    Latest Ref Rng & Units 07/28/2023    2:34 PM 03/30/2023   11:49 AM 06/10/2022    5:00 PM  CMP  Glucose 70 - 99 mg/dL 899  92  67   BUN 8 - 23 mg/dL 5  15  6    Creatinine 0.61 - 1.24 mg/dL 8.90  8.97  8.96   Sodium 135 - 145 mmol/L 144  144  145   Potassium 3.5 - 5.1 mmol/L 3.8  3.9  4.5   Chloride 98 - 111 mmol/L 109  110  104   CO2 22 - 32 mmol/L 29  28  22    Calcium 8.9 - 10.3 mg/dL 8.9  9.1  8.9   Total Protein 6.5 - 8.1 g/dL 7.5  7.5  7.4   Total Bilirubin 0.0 - 1.2 mg/dL 0.5  0.4  0.4   Alkaline Phos 38 - 126 U/L 49  56  63   AST 15 - 41 U/L 10  12  13    ALT 0 - 44 U/L 5  6  6     05/25/2023 Molecular genetics:    05/24/2023 Cytogenetics:   05/14/2023 Biopsy Lytic bone lesion:   RADIOGRAPHIC STUDIES: I have personally reviewed the radiological images as listed and agreed with the findings in the report.  No results found.   ASSESSMENT & PLAN:   68 y.o. male with:  Lytic lesion on CT scan in sternal manubrium C/W plasmacytoma on Biopsy. Newly diagnosed B12 deficiency.  PLAN:  -patient completed radiation therapy on 07/26/2023 and has no notable toxicities -discussed that the response rate for his local spot is generally more than 90% -discussed that his tumor in the sternum is in an area that is closer to the skin, which can cause increased sensitivity of the skin to radiation -continue to use Aquaphor cream to keep the sternum well moisturized, avoid continuous use of hot water  to the area, and limit direct sunlight to the area exposed to radiation therapy to support healing -discussed  proceeding options, including: Treating patient to address his remaining plasma cells. We discussed that patient does not meet criteria of having at least 10% clonal plasma cells. Discussed that his plasmacytoma is not curable.  Monitoring without additional treatment. Discussed that his  plasmacytoma can potentially remain stable without treatment for long time. -discussed that he does not meet criteria for myeloma at this time and it would be reasonable to monitor without any additional treatment at this time -discussed that the symptomatic element of his plasmacytoma has been treated with radiation -discussed that he has underlying increased plasma cells, which do not meet criteria for myeloma at this time. Discussed that it is possible that there could still be progression to myeloma, and we will monitor appropriate parameters, including his blood counts, kidney function, calcium levels, and seeing if there are any findings of bone tumors.  -it appears that patient was unable to come in for labs which were ordered for last week -patient is agreeable to proceed with labs today -will order myeloma labs post-radiation to determine baseline then continue to monitor -will repeat labs in 3 months -will repeat PET/CT in 3 months to evaluate the full effects of radiation to his noted hypermetabolic lytic lesion within the sternal manubrium  -answered all of paitent's and his wife's questions in detail  FOLLOW-UP: Labs today PET/CT in 12 weeks Labs in 12 weeks RTC with Dr Onesimo in 16 weeks  The total time spent in the appointment was 30 minutes* .  All of the patient's questions were answered with apparent satisfaction. The patient knows to call the clinic with any problems, questions or concerns.   Emaline Onesimo MD MS AAHIVMS [facility] Hematology/Oncology Physician [facility]  .*Total Encounter Time as defined by the Centers for Medicare and Medicaid Services includes, in addition to the face-to-face time of a patient visit (documented in the note above) non-face-to-face time: obtaining and reviewing outside history, ordering and reviewing medications, tests or procedures, care coordination (communications with other health care professionals or caregivers) and  documentation in the medical record.    I,Mitra Faeizi,acting as a Neurosurgeon for Emaline Onesimo, MD.,have documented all relevant documentation on the behalf of Emaline Onesimo, MD,as directed by  Emaline Onesimo, MD while in the presence of Emaline Onesimo, MD.  .I have reviewed the above documentation for accuracy and completeness, and I agree with the above. .Ilayda Toda Kishore Edie Darley MD

## 2023-07-27 NOTE — Radiation Completion Notes (Signed)
  Radiation Oncology         [phone] ________________________________  Name: Austin [name] MRN: [id number]  Date of Service: 07/26/2023  DOB: Jul 23, 1955  End of Treatment Note  Diagnosis:   Plasmacytoma of the sternal manubrium   Intent: Curative     ==========DELIVERED PLANS==========  First Treatment Date: 2023-06-17 Last Treatment Date: 2023-07-26   Plan Name: Chest_sternum Site: Chest Technique: 3D Mode: Photon Dose Per Fraction: 2 Gy Prescribed Dose (Delivered / Prescribed): 50 Gy / 50 Gy Prescribed Fxs (Delivered / Prescribed): 25 / 25     ==========ON TREATMENT VISIT DATES========== 2023-06-18, 2023-06-25, 2023-07-05, 2023-07-16, 2023-07-22    See weekly On Treatment Notes in Epic for details in the Media tab (listed as Progress notes on the On Treatment Visit Dates listed above). The patient tolerated radiation without complaints of fatigue or skin changes in the treatment field.   The patient will receive a call in about one month from the radiation oncology department. He will continue follow up with Dr. Onesimo as well.      Donald KYM Husband, PAC

## 2023-07-28 ENCOUNTER — Inpatient Hospital Stay

## 2023-07-28 ENCOUNTER — Inpatient Hospital Stay (HOSPITAL_BASED_OUTPATIENT_CLINIC_OR_DEPARTMENT_OTHER): Admitting: Hematology

## 2023-07-28 VITALS — BP 118/77 | HR 101 | Temp 97.9°F | Resp 18 | Wt 151.9 lb

## 2023-07-28 DIAGNOSIS — F17210 Nicotine dependence, cigarettes, uncomplicated: Secondary | ICD-10-CM | POA: Diagnosis not present

## 2023-07-28 DIAGNOSIS — E8809 Other disorders of plasma-protein metabolism, not elsewhere classified: Secondary | ICD-10-CM | POA: Diagnosis not present

## 2023-07-28 DIAGNOSIS — Z923 Personal history of irradiation: Secondary | ICD-10-CM | POA: Insufficient documentation

## 2023-07-28 DIAGNOSIS — E538 Deficiency of other specified B group vitamins: Secondary | ICD-10-CM | POA: Diagnosis not present

## 2023-07-28 DIAGNOSIS — C9030 Solitary plasmacytoma not having achieved remission: Secondary | ICD-10-CM | POA: Diagnosis present

## 2023-07-28 LAB — CMP (CANCER CENTER ONLY)
ALT: 5 U/L (ref 0–44)
AST: 10 U/L — ABNORMAL LOW (ref 15–41)
Albumin: 4.1 g/dL (ref 3.5–5.0)
Alkaline Phosphatase: 49 U/L (ref 38–126)
Anion gap: 6 (ref 5–15)
BUN: 5 mg/dL — ABNORMAL LOW (ref 8–23)
CO2: 29 mmol/L (ref 22–32)
Calcium: 8.9 mg/dL (ref 8.9–10.3)
Chloride: 109 mmol/L (ref 98–111)
Creatinine: 1.09 mg/dL (ref 0.61–1.24)
GFR, Estimated: >60 mL/min (ref >60)
Glucose, Bld: 100 mg/dL — ABNORMAL HIGH (ref 70–99)
Potassium: 3.8 mmol/L (ref 3.5–5.1)
Sodium: 144 mmol/L (ref 135–145)
Total Bilirubin: 0.5 mg/dL (ref 0.0–1.2)
Total Protein: 7.5 g/dL (ref 6.5–8.1)

## 2023-07-28 LAB — CBC WITH DIFFERENTIAL (CANCER CENTER ONLY)
Abs Immature Granulocytes: 0.02 K/uL (ref 0.00–0.07)
Basophils Absolute: 0 K/uL (ref 0.0–0.1)
Basophils Relative: 0 %
Eosinophils Absolute: 0.1 K/uL (ref 0.0–0.5)
Eosinophils Relative: 3 %
HCT: 41.7 % (ref 39.0–52.0)
Hemoglobin: 13.8 g/dL (ref 13.0–17.0)
Immature Granulocytes: 0 %
Lymphocytes Relative: 10 %
Lymphs Abs: 0.5 K/uL — ABNORMAL LOW (ref 0.7–4.0)
MCH: 29.6 pg (ref 26.0–34.0)
MCHC: 33.1 g/dL (ref 30.0–36.0)
MCV: 89.3 fL (ref 80.0–100.0)
Monocytes Absolute: 0.4 K/uL (ref 0.1–1.0)
Monocytes Relative: 8 %
Neutro Abs: 4 K/uL (ref 1.7–7.7)
Neutrophils Relative %: 79 %
Platelet Count: 260 K/uL (ref 150–400)
RBC: 4.67 MIL/uL (ref 4.22–5.81)
RDW: 14.9 % (ref 11.5–15.5)
WBC Count: 5 K/uL (ref 4.0–10.5)
nRBC: 0 % (ref 0.0–0.2)

## 2023-07-28 LAB — IRON AND IRON BINDING CAPACITY (CC-WL,HP ONLY)
Iron: 79 ug/dL (ref 45–182)
Saturation Ratios: 29 % (ref 17.9–39.5)
TIBC: 272 ug/dL (ref 250–450)
UIBC: 193 ug/dL (ref 117–376)

## 2023-07-28 LAB — FERRITIN: Ferritin: 210 ng/mL (ref 24–336)

## 2023-07-30 LAB — KAPPA/LAMBDA LIGHT CHAINS
Kappa free light chain: 71.1 mg/L — ABNORMAL HIGH (ref 3.3–19.4)
Kappa, lambda light chain ratio: 2.94 — ABNORMAL HIGH (ref 0.26–1.65)
Lambda free light chains: 24.2 mg/L (ref 5.7–26.3)

## 2023-08-01 LAB — MULTIPLE MYELOMA PANEL, SERUM
Albumin SerPl Elph-Mcnc: 3.9 g/dL (ref 2.9–4.4)
Albumin/Glob SerPl: 1.2 (ref 0.7–1.7)
Alpha 1: 0.2 g/dL (ref 0.0–0.4)
Alpha2 Glob SerPl Elph-Mcnc: 0.4 g/dL (ref 0.4–1.0)
B-Globulin SerPl Elph-Mcnc: 1.1 g/dL (ref 0.7–1.3)
Gamma Glob SerPl Elph-Mcnc: 1.6 g/dL (ref 0.4–1.8)
Globulin, Total: 3.4 g/dL (ref 2.2–3.9)
IgA: 322 mg/dL (ref 61–437)
IgG (Immunoglobin G), Serum: 1465 mg/dL (ref 603–1613)
IgM (Immunoglobulin M), Srm: 195 mg/dL — ABNORMAL HIGH (ref 20–172)
Total Protein ELP: 7.3 g/dL (ref 6.0–8.5)

## 2023-08-12 NOTE — Progress Notes (Signed)
 Follow up 07/28/23 with Dt Onesimo, completed XRT 07/26/23. Will have PET and labs in 3 months and FU with Dr Onesimo.

## 2023-08-23 ENCOUNTER — Encounter

## 2023-10-20 ENCOUNTER — Other Ambulatory Visit: Payer: Self-pay

## 2023-10-20 ENCOUNTER — Encounter (HOSPITAL_COMMUNITY)
Admission: RE | Admit: 2023-10-20 | Discharge: 2023-10-20 | Disposition: A | Discharge status: Home / Self Care | Source: Ambulatory Visit | Attending: Hematology | Admitting: Hematology

## 2023-10-20 ENCOUNTER — Inpatient Hospital Stay: Attending: Hematology

## 2023-10-20 DIAGNOSIS — E8809 Other disorders of plasma-protein metabolism, not elsewhere classified: Secondary | ICD-10-CM | POA: Insufficient documentation

## 2023-10-20 DIAGNOSIS — C9030 Solitary plasmacytoma not having achieved remission: Secondary | ICD-10-CM | POA: Insufficient documentation

## 2023-10-20 LAB — CBC WITH DIFFERENTIAL (CANCER CENTER ONLY)
Abs Immature Granulocytes: 0.04 K/uL (ref 0.00–0.07)
Basophils Absolute: 0 K/uL (ref 0.0–0.1)
Basophils Relative: 0 %
Eosinophils Absolute: 0 K/uL (ref 0.0–0.5)
Eosinophils Relative: 1 %
HCT: 39.6 % (ref 39.0–52.0)
Hemoglobin: 13.5 g/dL (ref 13.0–17.0)
Immature Granulocytes: 1 %
Lymphocytes Relative: 10 %
Lymphs Abs: 0.9 K/uL (ref 0.7–4.0)
MCH: 30 pg (ref 26.0–34.0)
MCHC: 34.1 g/dL (ref 30.0–36.0)
MCV: 88 fL (ref 80.0–100.0)
Monocytes Absolute: 0.6 K/uL (ref 0.1–1.0)
Monocytes Relative: 7 %
Neutro Abs: 7.2 K/uL (ref 1.7–7.7)
Neutrophils Relative %: 81 %
Platelet Count: 314 K/uL (ref 150–400)
RBC: 4.5 MIL/uL (ref 4.22–5.81)
RDW: 14.5 % (ref 11.5–15.5)
WBC Count: 8.8 K/uL (ref 4.0–10.5)
nRBC: 0 % (ref 0.0–0.2)

## 2023-10-20 LAB — CMP (CANCER CENTER ONLY)
ALT: 7 U/L (ref 0–44)
AST: 12 U/L — ABNORMAL LOW (ref 15–41)
Albumin: 4.3 g/dL (ref 3.5–5.0)
Alkaline Phosphatase: 55 U/L (ref 38–126)
Anion gap: 5 (ref 5–15)
BUN: 8 mg/dL (ref 8–23)
CO2: 29 mmol/L (ref 22–32)
Calcium: 9.8 mg/dL (ref 8.9–10.3)
Chloride: 111 mmol/L (ref 98–111)
Creatinine: 0.89 mg/dL (ref 0.61–1.24)
GFR, Estimated: >60 mL/min (ref >60)
Glucose, Bld: 109 mg/dL — ABNORMAL HIGH (ref 70–99)
Potassium: 3.5 mmol/L (ref 3.5–5.1)
Sodium: 145 mmol/L (ref 135–145)
Total Bilirubin: 0.8 mg/dL (ref 0.0–1.2)
Total Protein: 7.7 g/dL (ref 6.5–8.1)

## 2023-10-20 LAB — IRON AND IRON BINDING CAPACITY (CC-WL,HP ONLY)
Iron: 36 ug/dL — ABNORMAL LOW (ref 45–182)
Saturation Ratios: 12 % — ABNORMAL LOW (ref 17.9–39.5)
TIBC: 294 ug/dL (ref 250–450)
UIBC: 258 ug/dL (ref 117–376)

## 2023-10-20 LAB — FERRITIN: Ferritin: 314 ng/mL (ref 24–336)

## 2023-10-20 MED ORDER — FLUDEOXYGLUCOSE F - 18 (FDG) INJECTION: 7.5500 | Freq: Once | INTRAVENOUS | Status: DC

## 2023-10-21 LAB — KAPPA/LAMBDA LIGHT CHAINS
Kappa free light chain: 43.7 mg/L — ABNORMAL HIGH (ref 3.3–19.4)
Kappa, lambda light chain ratio: 1.73 — ABNORMAL HIGH (ref 0.26–1.65)
Lambda free light chains: 25.3 mg/L (ref 5.7–26.3)

## 2023-10-22 ENCOUNTER — Ambulatory Visit (HOSPITAL_COMMUNITY)
Admission: RE | Admit: 2023-10-22 | Discharge: 2023-10-22 | Disposition: A | Discharge status: Home / Self Care | Source: Ambulatory Visit | Attending: Hematology | Admitting: Hematology

## 2023-10-22 DIAGNOSIS — C9030 Solitary plasmacytoma not having achieved remission: Secondary | ICD-10-CM | POA: Insufficient documentation

## 2023-10-22 DIAGNOSIS — E8809 Other disorders of plasma-protein metabolism, not elsewhere classified: Secondary | ICD-10-CM | POA: Insufficient documentation

## 2023-10-22 LAB — GLUCOSE, CAPILLARY: Glucose-Capillary: 102 mg/dL — ABNORMAL HIGH (ref 70–99)

## 2023-10-22 MED ORDER — FLUDEOXYGLUCOSE F - 18 (FDG) INJECTION
7.5000 | Freq: Once | INTRAVENOUS | Status: AC | PRN
  Administered 2023-10-22: 7.5 via INTRAVENOUS

## 2023-10-25 LAB — MULTIPLE MYELOMA PANEL, SERUM
Albumin SerPl Elph-Mcnc: 3.6 g/dL (ref 2.9–4.4)
Albumin/Glob SerPl: 1.1 (ref 0.7–1.7)
Alpha 1: 0.3 g/dL (ref 0.0–0.4)
Alpha2 Glob SerPl Elph-Mcnc: 0.6 g/dL (ref 0.4–1.0)
B-Globulin SerPl Elph-Mcnc: 1 g/dL (ref 0.7–1.3)
Gamma Glob SerPl Elph-Mcnc: 1.7 g/dL (ref 0.4–1.8)
Globulin, Total: 3.6 g/dL (ref 2.2–3.9)
IgA: 337 mg/dL (ref 61–437)
IgG (Immunoglobin G), Serum: 1527 mg/dL (ref 603–1613)
IgM (Immunoglobulin M), Srm: 177 mg/dL — ABNORMAL HIGH (ref 20–172)
Total Protein ELP: 7.2 g/dL (ref 6.0–8.5)

## 2023-11-23 ENCOUNTER — Ambulatory Visit: Payer: Medicare HMO

## 2023-11-23 ENCOUNTER — Encounter (INDEPENDENT_AMBULATORY_CARE_PROVIDER_SITE_OTHER): Payer: Self-pay | Admitting: *Deleted

## 2023-11-23 VITALS — Ht 69.0 in | Wt 155.0 lb

## 2023-11-23 DIAGNOSIS — Z0000 Encounter for general adult medical examination without abnormal findings: Secondary | ICD-10-CM | POA: Diagnosis not present

## 2023-11-23 DIAGNOSIS — Z1211 Encounter for screening for malignant neoplasm of colon: Secondary | ICD-10-CM

## 2023-11-23 NOTE — Progress Notes (Signed)
 Chief Complaint  Patient presents with   Medicare Wellness    Subjective:   Austin [name] is a 68 y.o. male who presents for a [employer] Visit.  Allergies (verified) Fish allergy   History: Past Medical History:  Diagnosis Date   Dyspnea    Hip fracture (HCC)    Hypertension    Stroke [facility])    Past Surgical History:  Procedure Laterality Date   COLONOSCOPY N/A 11/05/2017   Procedure: COLONOSCOPY;  Surgeon: Shaaron Lamar HERO, MD;  [facility];  Service: Endoscopy;  Laterality: N/A;  12:00   HIP PINNING,CANNULATED Right 07/24/2021   Procedure: CANNULATED HIP PINNING;  Surgeon: Margrette Taft BRAVO, MD;  Location: [facility];  Service: Orthopedics;  Laterality: Right;   POLYPECTOMY  11/05/2017   Procedure: POLYPECTOMY;  Surgeon: Shaaron Lamar HERO, MD;  Location[facility];  Service: Endoscopy;;  colon   Family History  Problem Relation Age of Onset   Kidney disease Mother    Hypertension Mother    Cancer Mother    Hypertension Father    Atrial fibrillation Father    Heart attack Father    Heart failure Other    Cancer Other    Social History   Occupational History   Not on file  Tobacco Use   Smoking status: Every Day    Current packs/day: 2.00    Average packs/day: 2.0 packs/day for 50.9 years (101.8 ttl pk-yrs)    Types: Cigarettes    Start date: 1975   Smokeless tobacco: Never  Vaping Use   Vaping status: Never Used  Substance and Sexual Activity   Alcohol use: Yes    Comment: beer 2 cans per day   Drug use: Yes    Types: Marijuana   Sexual activity: Yes    Birth control/protection: None   Tobacco Counseling Ready to quit: Yes Counseling given: Yes  SDOH Screenings   Food Insecurity: No Food Insecurity (11/23/2023)  Housing: Low Risk  (11/23/2023)  Transportation Needs: No Transportation Needs (11/23/2023)  Utilities: Not At Risk (11/23/2023)  Alcohol Screen: Low Risk  (11/19/2022)  Depression (PHQ2-9): Low Risk   (11/23/2023)  Financial Resource Strain: Low Risk  (11/19/2022)  Physical Activity: Inactive (11/23/2023)  Social Connections: Socially Isolated (11/23/2023)  Stress: No Stress Concern Present (11/23/2023)  Tobacco Use: High Risk (11/23/2023)  Health Literacy: Adequate Health Literacy (11/23/2023)   See flowsheets for full screening details  Depression Screen Depression Screening Exception Documentation Depression Screening Exception:: Other- indicate reason in comment box Depression Screening Exception Comment:: unable to assess spk w/ pt daughter  PHQ 2 & 9 Depression Scale- Over the past 2 weeks, how often have you been bothered by any of the following problems? Little interest or pleasure in doing things: 0 Feeling down, depressed, or hopeless (PHQ Adolescent also includes...irritable): 0 PHQ-2 Total Score: 0 Trouble falling or staying asleep, or sleeping too much: 0 Feeling tired or having little energy: 0 Poor appetite or overeating (PHQ Adolescent also includes...weight loss): 0 Feeling bad about yourself - or that you are a failure or have let yourself or your family down: 0 Trouble concentrating on things, such as reading the newspaper or watching television (PHQ Adolescent also includes...like school work): 0 Moving or speaking so slowly that other people could have noticed. Or the opposite - being so fidgety or restless that you have been moving around a lot more than usual: 0 Thoughts that you would be better off dead, or of  hurting yourself in some way: 0 PHQ-9 Total Score: 0 If you checked off any problems, how difficult have these problems made it for you to do your work, take care of things at home, or get along with other people?: Not difficult at all  Depression Treatment Depression Interventions/Treatment : Patient refuses Treatment     Goals Addressed               This Visit's Progress     Stop Smoking (pt-stated)         Visit info / Clinical  Intake: Medicare Wellness Visit Type:: Subsequent Annual Wellness Visit Persons participating in visit:: caregiver (with patient present) Medicare Wellness Visit Mode:: Telephone If telephone:: video declined Because this visit was a virtual/telehealth visit:: pt reported vitals If Telephone or Video please confirm:: I connected with the patient using audio enabled telemedicine application and verified that I am speaking with the correct person using two identifiers; I discussed the limitations of evaluation and management by telemedicine; The patient expressed understanding and agreed to proceed Patient Location:: home Provider Location:: [facility] office Information given by:: family Database Administrator) Interpreter Needed?: No Pre-visit prep was completed: yes AWV questionnaire completed by patient prior to visit?: no Living arrangements:: with family/others (son lives with patient) Patient's Overall Health Status Rating: (!) fair Typical amount of pain: none Does pain affect daily life?: no Are you currently prescribed opioids?: no  Dietary Habits and Nutritional Risks How many meals a day?: 3 Eats fruit and vegetables daily?: yes Most meals are obtained by: having others provide food In the last 2 weeks, have you had any of the following?: none Diabetic:: no  Functional Status Activities of Daily Living (to include ambulation/medication): (!) Needs Assist Feeding: Independent Dressing/Grooming: Independent Bathing: Independent (can do it himself, someone just monitors him) Toileting: Independent Transfer: Independent Ambulation: Independent with device- listed below Home Assistive Devices/Equipment: Walker (specify Type) (rollator walker) Medication Administration: Needs assistance (comment) (patient can physically take his medicine by himself, however, he has to have reminders as to when to take it) Is this a change from baseline?: Pre-admission baseline Home Management: Dependent  (family and Home Health Aid take care of the home management. home health aid comes in 7 days a week.) Manage your own finances?: (!) no Primary transportation is: family/friends Concerns about hearing?: no  Fall Screening Falls in the past year?: 0 Number of falls in past year: 0 Was there an injury with Fall?: 0 Fall Risk Category Calculator: 0 Patient Fall Risk Level: Low Fall Risk  Fall Risk Patient at Risk for Falls Due to: Impaired balance/gait Fall risk Follow up: Falls evaluation completed; Education provided; Falls prevention discussed  Home and Transportation Safety: All rugs have non-skid backing?: N/A, no rugs All stairs or steps have railings?: N/A, no stairs Grab bars in the bathtub or shower?: yes Have non-skid surface in bathtub or shower?: yes Good home lighting?: yes Regular seat belt use?: yes Hospital stays in the last year:: no  Cognitive Assessment Difficulty concentrating, remembering, or making decisions? : yes Will 6CIT or Mini Cog be Completed: no 6CIT or Mini Cog Declined: patient has a diagnosis of dementia or cognitive impairment  Advance Directives (For Healthcare) Does Patient Have a Medical Advance Directive?: Yes Does patient want to make changes to medical advance directive?: No - Patient declined Type of Advance Directive: Healthcare Power of Attorney Copy of Healthcare Power of Attorney in Chart?: No - copy requested  Reviewed/Updated  Reviewed/Updated: Reviewed All (Medical,  Surgical, Family, Medications, Allergies, Care Teams, Patient Goals)   Patients HCPOA Austin [name] provided majority of information due to patients diagnosis of alcohol induced dementia     Objective:    Today's Vitals   11/23/23 0845  Weight: 155 lb (70.3 kg)  Height: 5' 9 (1.753 m)   Body mass index is 22.89 kg/m.  Current Medications (verified) Outpatient Encounter Medications as of 11/23/2023  Medication Sig   amLODipine  (NORVASC ) 10 MG tablet Take  1 tablet (10 mg total) by mouth daily.   olmesartan  (BENICAR ) 20 MG tablet Take 1 tablet (20 mg total) by mouth daily.   No facility-administered encounter medications on file as of 11/23/2023.   Hearing/Vision screen Hearing Screening - Comments:: Patient denies any hearing difficulties.   Vision Screening - Comments:: Patient does not have an eye doctor. A list of eye doctors has been provided to the patient.   Immunizations and Health Maintenance Health Maintenance  Topic Date Due   DTaP/Tdap/Td (1 - Tdap) Never done   Zoster Vaccines- Shingrix (1 of 2) Never done   Pneumococcal Vaccine: 50+ Years (2 of 2 - PPSV23, PCV20, or PCV21) 05/11/2019   COVID-19 Vaccine (3 - Pfizer risk series) 10/12/2019   Influenza Vaccine  08/06/2023   Medicare Annual Wellness (AWV)  11/19/2023   Lung Cancer Screening  02/18/2024   Colonoscopy  11/06/2027   Hepatitis C Screening  Completed   Meningococcal B Vaccine  Aged Out        Assessment/Plan:  This is a routine wellness examination for Austin [name].  Patient Care Team: Bevely Doffing, FNP as PCP - General (Family Medicine) Onesimo Emaline Brink, MD as Consulting Physician (Hematology)  I have personally reviewed and noted the following in the patient's chart:   Medical and social history Use of alcohol, tobacco or illicit drugs  Current medications and supplements including opioid prescriptions. Functional ability and status Nutritional status Physical activity Advanced directives List of other physicians Hospitalizations, surgeries, and ER visits in previous 12 months Vitals Screenings to include cognitive, depression, and falls Referrals and appointments  No orders of the defined types were placed in this encounter.  In addition, I have reviewed and discussed with patient certain preventive protocols, quality metrics, and best practice recommendations. A written personalized care plan for preventive services as well as general  preventive health recommendations were provided to patient.   Asherah Lavoy, CMA   11/23/2023   No follow-ups on file.  After Visit Summary: (MyChart) Due to this being a telephonic visit, the after visit summary with patients personalized plan was offered to patient via MyChart   Nurse Notes: transfer of care appt made for March

## 2023-11-23 NOTE — Patient Instructions (Signed)
 Austin [name],  Thank you for taking the time for your Medicare Wellness Visit. I appreciate your continued commitment to your health goals. Please review the care plan we discussed, and feel free to reach out if I can assist you further.  Please note that Annual Wellness Visits do not include a physical exam. Some assessments may be limited, especially if the visit was conducted virtually. If needed, we may recommend an in-person follow-up with your provider.  Ongoing Care Seeing your primary care provider every 3 to 6 months helps us  monitor your health and provide consistent, personalized care.   Upcoming Appointments:  Transfer of Care/Establish with Leita Longs, FNP: March 14, 2024 AT 10:20 am  1 year Medicare Annual Wellness Visit: November 24, 2024 at 11:20 am in office  Referrals If a referral was made during today's visit and you haven't received any updates within two weeks, please contact the referred provider directly to check on the status.  [facility] at  [address] Phone: [phone]   When you have completed your advanced care planning documents, you may either personally take them into the office or mail/email them to the addresses listed below:   Recommended Screenings:  Health Maintenance  Topic Date Due   DTaP/Tdap/Td vaccine (1 - Tdap) Never done   Zoster (Shingles) Vaccine (1 of 2) Never done   Pneumococcal Vaccine for age over 55 (2 of 2 - PPSV23, PCV20, or PCV21) 05/11/2019   COVID-19 Vaccine (3 - Pfizer risk series) 10/12/2019   Flu Shot  08/06/2023   Medicare Annual Wellness Visit  11/19/2023   Screening for Lung Cancer  02/18/2024   Colon Cancer Screening  11/06/2027   Hepatitis C Screening  Completed   Meningitis B Vaccine  Aged Out       05/14/2023    9:43 AM  Advanced Directives  Does Patient Have a Medical Advance Directive? Yes  Type of Estate Agent of  [place];Living will  Does patient want to make changes to medical advance directive? No - Patient declined  Copy of Healthcare Power of Attorney in Chart? No - copy requested    Vision: Annual vision screenings are recommended for early detection of glaucoma, cataracts, and diabetic retinopathy. These exams can also reveal signs of chronic conditions such as diabetes and high blood pressure.  Dental: Annual dental screenings help detect early signs of oral cancer, gum disease, and other conditions linked to overall health, including heart disease and diabetes.  Please see the attached documents for additional preventive care recommendations.

## 2023-11-24 ENCOUNTER — Inpatient Hospital Stay: Attending: Hematology | Admitting: Hematology

## 2023-11-24 ENCOUNTER — Inpatient Hospital Stay

## 2023-11-24 ENCOUNTER — Inpatient Hospital Stay: Admitting: Hematology

## 2023-11-24 VITALS — BP 127/84 | HR 87 | Temp 97.9°F | Resp 16 | Wt 145.7 lb

## 2023-11-24 DIAGNOSIS — C609 Malignant neoplasm of penis, unspecified: Secondary | ICD-10-CM

## 2023-11-24 DIAGNOSIS — C9030 Solitary plasmacytoma not having achieved remission: Secondary | ICD-10-CM | POA: Insufficient documentation

## 2023-11-24 NOTE — Progress Notes (Signed)
 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  HISTORY OF PRESENTING ILLNESS: (03/30/2023) Austin [name] is a wonderful 68 y.o. male who has been referred to us  by NP Lauraine Lites for evaluation and management of lytic bone lesions on CT scan.    CT Chest Lung CA Screen Low dose W/O CM from 02/18/2023 showed Lytic lesion and age-indeterminate pathologic fracture of the manubrium, worrisome for multiple myeloma.   Patient is accompanied by his daughter during this visit. Patient notes he has been doing well overall without any new or severe medical concerns in the past 87-months. He denies any bone pain, chest pain, or joint pain in the past 12-months. He denies any recent falls or injuries to the chest wall.    He did have a fall in 2014, where he had hip surgery.    He denies any recent infection issues, fever, chills, night sweats, unexpected weight loss, back pain, abdominal pain, new lump/bumps, or leg swelling.    He does complain of balance issues. He uses walking stick and wheel-chair.    Patient currently lives independently and he has a home caretaker. His daughter notes that the current nursing aid is at the patient's house for 6-7 hours. His current home-care facility is [facility]. Patient's daughter is currently in the process to change the home-care facility.    He denies diabetes or lung-disease.   PmHx of COPD, emphysema, hypertension, pericardial effusion, and alcohol-induced dementia. Patient's daughter reports that the patient occasionally has hallucinations due to alcohol-induced dementia. He is currently not getting treated for pericardial effusion.    He currently smokes 2 packs of  cigarettes a day. He used to be a heavy drinker - around 6-12 packs of beer a day (around 4 years ago). Patient's daughter notes he now occasionally drinks 1-2 beer in a month. He does have past history of drug abuse.    Patient did have a PET scan on 03/25/2023, but still waiting on results.  INTERVAL HISTORY:  Austin [name] is a 68 y.o. 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 accompanied by daughter and adult caretaker, ambulating with a economist.   he was last seen by me on 07/28/2023; at the time he mentioned experiencing some skin changes attributed to radiation.   Today, he says that he has been doing well since his radiation treatment. Denies any chest pain, new lumps/bumps, bowel/urinary changes, or SOB. He says that he has been eating well.   After reviewing his PET scan results, he says that he has not noticed any abnormality around his penis. His daughter notes hx of genital warts. Once these were treated, he'd stopped following with his physician.  REVIEW OF SYSTEMS:   10 Point review of systems of done and is negative except as noted above.  MEDICAL HISTORY Past Medical History:  Diagnosis Date   Dyspnea    Hip fracture (HCC)    Hypertension    Stroke [facility]     SURGICAL HISTORY Past Surgical History:  Procedure Laterality Date   COLONOSCOPY N/A 11/05/2017   Procedure: COLONOSCOPY;  Surgeon: Shaaron Lamar HERO, MD;  Location: [facility];  Service: Endoscopy;  Laterality: N/A;  12:00   HIP PINNING,CANNULATED Right 07/24/2021  Procedure: CANNULATED HIP PINNING;  Surgeon: Margrette Taft BRAVO, MD;  Location: [facility] ORS;  Service: Orthopedics;  Laterality: Right;   POLYPECTOMY  11/05/2017   Procedure: POLYPECTOMY;  Surgeon: Shaaron Lamar HERO, MD;  Location: [facility];  Service: Endoscopy;;  colon    SOCIAL HISTORY Social History   Tobacco Use   Smoking status: Every Day    Current packs/day: 2.00     Average packs/day: 2.0 packs/day for 50.9 years (101.8 ttl pk-yrs)    Types: Cigarettes    Start date: 1975   Smokeless tobacco: Never  Vaping Use   Vaping status: Never Used  Substance Use Topics   Alcohol use: Yes    Comment: beer 2 cans per day   Drug use: Yes    Types: Marijuana    Social History   Social History Narrative   Not on file    SOCIAL DRIVERS OF HEALTH SDOH Screenings   Food Insecurity: No Food Insecurity (11/23/2023)  Housing: Low Risk  (11/23/2023)  Transportation Needs: No Transportation Needs (11/23/2023)  Utilities: Not At Risk (11/23/2023)  Alcohol Screen: Low Risk  (11/19/2022)  Depression (PHQ2-9): Low Risk  (11/23/2023)  Financial Resource Strain: Low Risk  (11/19/2022)  Physical Activity: Inactive (11/23/2023)  Social Connections: Socially Isolated (11/23/2023)  Stress: No Stress Concern Present (11/23/2023)  Tobacco Use: High Risk (11/23/2023)  Health Literacy: Adequate Health Literacy (11/23/2023)     FAMILY HISTORY Family History  Problem Relation Age of Onset   Kidney disease Mother    Hypertension Mother    Cancer Mother    Hypertension Father    Atrial fibrillation Father    Heart attack Father    Heart failure Other    Cancer Other      ALLERGIES: is allergic to fish allergy.  MEDICATIONS  Current Outpatient Medications  Medication Sig Dispense Refill   amLODipine  (NORVASC ) 10 MG tablet Take 1 tablet (10 mg total) by mouth daily. 90 tablet 1   olmesartan  (BENICAR ) 20 MG tablet Take 1 tablet (20 mg total) by mouth daily. 60 tablet 0   No current facility-administered medications for this visit.    PHYSICAL EXAMINATION: ECOG PERFORMANCE STATUS: 1 - Symptomatic but completely ambulatory VITALS: Vitals:   11/24/23 1215  BP: 127/84  Pulse: 87  Resp: 16  Temp: 97.9 F (36.6 C)  SpO2: 98%   Filed Weights   11/24/23 1215  Weight: 145 lb 11.2 oz (66.1 kg)   Body mass index is 21.52 kg/m.  GENERAL: alert, in no  acute distress and comfortable SKIN: no acute rashes, (+) Extensive penile warts  EYES: conjunctiva are pink and non-injected, sclera anicteric OROPHARYNX: MMM, no exudates, no oropharyngeal erythema or ulceration NECK: supple, no JVD LYMPH:  no palpable lymphadenopathy in the cervical, axillary or inguinal regions LUNGS: clear to auscultation b/l with normal respiratory effort HEART: regular rate & rhythm ABDOMEN:  normoactive bowel sounds , non tender, not distended, no hepatosplenomegaly Extremity: no pedal edema PSYCH: alert & oriented x 3 with fluent speech NEURO: no focal motor/sensory deficits    LABORATORY DATA:   I have reviewed the data as listed     Latest Ref Rng & Units 10/20/2023    2:12 PM 07/28/2023    2:34 PM 05/14/2023    9:32 AM  CBC EXTENDED  WBC 4.0 - 10.5 K/uL 8.8  5.0  10.2   RBC 4.22 - 5.81 MIL/uL 4.50  4.67  4.77   Hemoglobin 13.0 - 17.0 g/dL 86.4  13.8  14.1   HCT 39.0 - 52.0 % 39.6  41.7  43.3   Platelets 150 - 400 K/uL 314  260  311   NEUT# 1.7 - 7.7 K/uL 7.2  4.0  5.2   Lymph# 0.7 - 4.0 K/uL 0.9  0.5  3.7        Latest Ref Rng & Units 10/20/2023    2:12 PM 07/28/2023    2:34 PM 03/30/2023   11:49 AM  CMP  Glucose 70 - 99 mg/dL 890  899  92   BUN 8 - 23 mg/dL 8  5  15    Creatinine 0.61 - 1.24 mg/dL 9.10  8.90  8.97   Sodium 135 - 145 mmol/L 145  144  144   Potassium 3.5 - 5.1 mmol/L 3.5  3.8  3.9   Chloride 98 - 111 mmol/L 111  109  110   CO2 22 - 32 mmol/L 29  29  28    Calcium 8.9 - 10.3 mg/dL 9.8  8.9  9.1   Total Protein 6.5 - 8.1 g/dL 7.7  7.5  7.5   Total Bilirubin 0.0 - 1.2 mg/dL 0.8  0.5  0.4   Alkaline Phos 38 - 126 U/L 55  49  56   AST 15 - 41 U/L 12  10  12    ALT 0 - 44 U/L 7  5  6     MULTIPLE MYELOMA & KAPPA/LAMBDA LIGHT CHAINS 03/2023 - 10/2023    05/25/2023 Molecular genetics:      05/24/2023 Cytogenetics:    05/14/2023 Biopsy Lytic bone lesion:     RADIOGRAPHIC STUDIES: I have personally reviewed the radiological  images as listed and agreed with the findings in the report. NM PET Image Restage (PS) Whole Body Result Date: 10/26/2023 EXAM: PET AND CT SKULL BASE TO MID THIGH 10/22/2023 06:15:48 PM TECHNIQUE: RADIOPHARMACEUTICAL: 7.54 mCi F-18 FDG Uptake time 60 minutes. Glucose level 102 mg/dl. PET imaging was acquired from the base of the skull to the mid thighs. Non-contrast enhanced computed tomography was obtained for attenuation correction and anatomic localization. COMPARISON: PET CT 03/25/2023. CLINICAL HISTORY: Hematologic malignancy, assess treatment response; Plasmacytoma /myeloma for evaluation of response to treatment. 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 Order: EOV. PER PATIENT: no recent surgeries, biopsies, or treatments. Pt incorrectly scanned as skull to thigh, error was caught and re-scanned as a whole body. FINDINGS: HEAD AND NECK: No metabolically active cervical lymphadenopathy. CHEST: Interval resolution of metabolic activity associated with the manubrium. Lesion measures 3.8 cm on imaging 52. No metabolically active pulmonary nodules. No metabolically active lymphadenopathy. ABDOMEN AND PELVIS: There is a persistent intense focus of metabolic activity at the base of the penis on the right side with SUVmax equal 9.9 on image 178. There is an exophytic polypoid lesion measuring 18 mm on image 178 of CT series 4. No metabolically active intraperitoneal mass. No metabolically active lymphadenopathy. Physiologic activity within the gastrointestinal and genitourinary systems. BONES AND SOFT TISSUE: No additional hypermetabolic lesions are present within the skeleton. No suspicious lytic lesions on the CT portion of the exam. No soft tissue plasmacytoma. Internal fixation of the left hip. IMPRESSION: 1. Resolution of metabolic activity associated with the lytic manubrium lesion. 2. No additional hypermetabolic  osseous lesions or soft tissue plasmacytoma. 3. Persistent intense focus of metabolic activity at the right penile base corresponding to an exophytic polypoid lesion measuring 18 mm. Recommend clinical correlation for benign or malignant neoplasm.  4. These results will be called to the ordering clinician or representative by the radiologist assistant, and communication documented in the pacs or clario dashboard Electronically signed by: Norleen Boxer MD 10/26/2023 04:18 PM EDT RP Workstation: [id number]     ASSESSMENT & PLAN:  68 y.o. male with  Lytic lesion on CT scan in sternal manubrium C/W plasmacytoma on Biopsy.  Newly diagnosed B12 deficiency.  PLAN: - Discussed lab results on 11/24/2023 in detail with patient: CBC normal  CMP stable.  10/20/2023 M protein undetected and Kappa/Lambda Lights Chains has decreased.   Continue to monitor.  - Reviewed 10/26/2023 PET scan: 1. Resolution of metabolic activity associated with the lytic manubrium lesion.   Tumor has resolved with the radiation, no signs of progression or metastasizing.  2. No additional hypermetabolic osseous lesions or soft tissue plasmacytoma.  3. Persistent intense focus of metabolic activity at the right penile base corresponding to an exophytic polypoid lesion measuring 18 mm. Recommend clinical correlation for benign or malignant neoplasm.   Referral to Urology.  - Evaluated patient's penile warts, these were extensive and there was some skin changes.  FOLLOW-UP  -Referral to urology for extensive skin lesion at the base of Penis -- warts +/- Squamous cell carcinoma -labs in 16 weeks  -RTC with Dr Onesimo in 18 weeks  The total time spent in the appointment was 30 minutes* .  All of the patient's questions were answered and the patient knows to call the clinic with any problems, questions, or concerns.  Emaline Onesimo MD MS AAHIVMS [facility] Hematology/Oncology Physician [facility]  *Total Encounter  Time as defined by the Centers for Medicare and Medicaid Services includes, in addition to the face-to-face time of a patient visit (documented in the note above) non-face-to-face time: obtaining and reviewing outside history, ordering and reviewing medications, tests or procedures, care coordination (communications with other health care professionals or caregivers) and documentation in the medical record.  I,Emily Lagle,acting as a neurosurgeon for Emaline Onesimo, MD.,have documented all relevant documentation on the behalf of Emaline Onesimo, MD,as directed by  Emaline Onesimo, MD while in the presence of Emaline Onesimo, MD.  I have reviewed the above documentation for accuracy and completeness, and I agree with the above.  Pastor Sgro, MD

## 2023-12-16 ENCOUNTER — Telehealth: Payer: Self-pay

## 2023-12-16 NOTE — Telephone Encounter (Unsigned)
 Copied from CRM [id number]. Topic: Clinical - Order For Equipment >> Dec 16, 2023 11:04 AM Tiffini S wrote: Reason for CRM:  Annabella All faxed a request for DME supplies on 12/09/23 for new and continued incontinence supplies / verified the fax number as correct/ will refax request again   Please call to follow up ph: [phone]/ fax: [phone]

## 2024-03-14 ENCOUNTER — Encounter

## 2024-03-15 ENCOUNTER — Inpatient Hospital Stay

## 2024-03-29 ENCOUNTER — Inpatient Hospital Stay: Admitting: Hematology

## 2024-11-24 ENCOUNTER — Ambulatory Visit
